# Patient Record
Sex: Male | Born: 1960 | Race: White | Hispanic: No | Marital: Married | Smoking: Never smoker
Health system: Southern US, Community
[De-identification: ages and names within clinical notes are randomized; demographics above are authoritative.]

## PROBLEM LIST (undated history)

## (undated) DIAGNOSIS — K649 Unspecified hemorrhoids: Secondary | ICD-10-CM

## (undated) DIAGNOSIS — R011 Cardiac murmur, unspecified: Secondary | ICD-10-CM

## (undated) DIAGNOSIS — Z8489 Family history of other specified conditions: Secondary | ICD-10-CM

## (undated) DIAGNOSIS — K635 Polyp of colon: Secondary | ICD-10-CM

## (undated) DIAGNOSIS — M2550 Pain in unspecified joint: Secondary | ICD-10-CM

## (undated) DIAGNOSIS — G4730 Sleep apnea, unspecified: Secondary | ICD-10-CM

## (undated) DIAGNOSIS — M1990 Unspecified osteoarthritis, unspecified site: Secondary | ICD-10-CM

## (undated) DIAGNOSIS — D369 Benign neoplasm, unspecified site: Secondary | ICD-10-CM

## (undated) DIAGNOSIS — I10 Essential (primary) hypertension: Secondary | ICD-10-CM

## (undated) HISTORY — DX: Essential (primary) hypertension: I10

## (undated) HISTORY — PX: OTHER SURGICAL HISTORY: SHX169

## (undated) HISTORY — DX: Polyp of colon: K63.5

## (undated) HISTORY — PX: HERNIA REPAIR: SHX51

## (undated) HISTORY — DX: Pain in unspecified joint: M25.50

## (undated) HISTORY — DX: Sleep apnea, unspecified: G47.30

## (undated) HISTORY — DX: Benign neoplasm, unspecified site: D36.9

## (undated) HISTORY — PX: PLANTAR FASCIA RELEASE: SHX2239

## (undated) HISTORY — PX: APPENDECTOMY: SHX54

## (undated) HISTORY — PX: STAPLE HEMORRHOIDECTOMY: SHX2438

## (undated) HISTORY — DX: Unspecified hemorrhoids: K64.9

---

## 2000-12-22 ENCOUNTER — Ambulatory Visit (HOSPITAL_COMMUNITY): Admission: RE | Admit: 2000-12-22 | Discharge: 2000-12-22 | Payer: Self-pay | Admitting: Internal Medicine

## 2003-06-13 ENCOUNTER — Encounter (INDEPENDENT_AMBULATORY_CARE_PROVIDER_SITE_OTHER): Payer: Self-pay | Admitting: Specialist

## 2003-06-13 ENCOUNTER — Ambulatory Visit (HOSPITAL_COMMUNITY): Admission: RE | Admit: 2003-06-13 | Discharge: 2003-06-13 | Payer: Self-pay | Admitting: General Surgery

## 2005-10-25 ENCOUNTER — Ambulatory Visit (HOSPITAL_COMMUNITY): Admission: RE | Admit: 2005-10-25 | Discharge: 2005-10-25 | Payer: Self-pay | Admitting: Internal Medicine

## 2006-11-18 ENCOUNTER — Ambulatory Visit (HOSPITAL_BASED_OUTPATIENT_CLINIC_OR_DEPARTMENT_OTHER): Admission: RE | Admit: 2006-11-18 | Discharge: 2006-11-18 | Payer: Self-pay | Admitting: Orthopedic Surgery

## 2007-10-26 ENCOUNTER — Ambulatory Visit (HOSPITAL_COMMUNITY): Admission: RE | Admit: 2007-10-26 | Discharge: 2007-10-26 | Payer: Self-pay | Admitting: General Surgery

## 2007-10-30 ENCOUNTER — Other Ambulatory Visit: Admission: RE | Admit: 2007-10-30 | Discharge: 2007-10-30 | Payer: Self-pay | Admitting: Otolaryngology

## 2007-11-16 ENCOUNTER — Encounter: Admission: RE | Admit: 2007-11-16 | Discharge: 2007-11-16 | Payer: Self-pay | Admitting: Otolaryngology

## 2009-04-08 DIAGNOSIS — D369 Benign neoplasm, unspecified site: Secondary | ICD-10-CM

## 2009-04-08 HISTORY — DX: Benign neoplasm, unspecified site: D36.9

## 2009-12-04 ENCOUNTER — Encounter: Payer: Self-pay | Admitting: Internal Medicine

## 2009-12-08 ENCOUNTER — Encounter: Payer: Self-pay | Admitting: Internal Medicine

## 2009-12-13 ENCOUNTER — Ambulatory Visit (HOSPITAL_COMMUNITY): Admission: RE | Admit: 2009-12-13 | Discharge: 2009-12-13 | Payer: Self-pay | Admitting: Internal Medicine

## 2009-12-13 ENCOUNTER — Ambulatory Visit: Payer: Self-pay | Admitting: Internal Medicine

## 2009-12-13 HISTORY — PX: COLONOSCOPY: SHX174

## 2009-12-18 ENCOUNTER — Encounter: Payer: Self-pay | Admitting: Internal Medicine

## 2009-12-22 ENCOUNTER — Ambulatory Visit: Admission: RE | Admit: 2009-12-22 | Discharge: 2009-12-22 | Payer: Self-pay | Admitting: Internal Medicine

## 2010-05-08 NOTE — Letter (Signed)
Summary: Internal Other /triage/rx instructions  Internal Other /triage/rx instructions   Imported By: Cloria Spring LPN 16/01/9603 54:09:81  _____________________________________________________________________  External Attachment:    Type:   Image     Comment:   External Document

## 2010-05-08 NOTE — Letter (Signed)
Summary: Patient Notice, Colon Biopsy Results  [facility]  [address]   [place], [place] [address]   Phone: [phone]  Fax: [phone]       December 18, 2009   Carl Kaufman [address] [place], [place]  [address] July 04, 1960    Dear [name],  I am pleased to inform you that the biopsies taken during your recent colonoscopy did not show any evidence of cancer upon pathologic examination.  Additional information/recommendations:  You should have a repeat colonoscopy examination  in 7 years.  Please call us if you are having persistent problems or have questions about your condition that have not been fully answered at this time.  Sincerely,    R. Roetta Sessions MD, Caleen Essex  [facility] Ph: [phone]    Fax: [phone]   Appended Document: Patient Notice, Colon Biopsy Results letter mailed to pt  Appended Document: Patient Notice, Colon Biopsy Results REMINDER FOR TCS IN 7 YRS IS IN THE COMPUTER

## 2010-05-08 NOTE — Letter (Signed)
Summary: External Other  External Other   Imported By: Rexene Alberts 12/04/2009 16:52:39  _____________________________________________________________________  External Attachment:    Type:   Image     Comment:   External Document

## 2010-08-21 NOTE — Op Note (Signed)
NAME:  Carl Kaufman, Carl Kaufman NO.:  [id number]   MEDICAL RECORD NO.:  [id number]          PATIENT TYPE:  AMB   LOCATION:  DAY                           FACILITY:  [facility]   PHYSICIAN:  Dalia Heading, M.D.  DATE OF BIRTH:  [birth year]   DATE OF PROCEDURE:  10/26/2007  DATE OF DISCHARGE:                               OPERATIVE REPORT   PREOPERATIVE DIAGNOSIS:  Right inguinal hernia.   POSTOPERATIVE DIAGNOSIS:  Right inguinal hernia.   PROCEDURE:  Right inguinal herniorrhaphy.   SURGEON:  Dalia Heading, MD   ANESTHESIA:  General.   INDICATIONS:  The patient is a 50 year old white male who presents with  a symptomatic right inguinal hernia.  The risks and benefits of the  procedure including bleeding, infection, pain, and recurrence of the  hernia were fully explained to the patient, I gave informed consent.   PROCEDURE NOTE:  The patient was placed in the supine position.  After  general anesthesia was administered, the right inguinal region was  prepped and draped in the usual sterile technique with Betadine.  Surgical site confirmation was performed.   A transverse incision was made in the right groin region down to the  external oblique aponeurosis.  The aponeurosis was incised to the  external ring.  A Penrose drain was placed around the spermatic cord.  The vas deferens was noted within the spermatic cord.  The ilioinguinal  nerve was identified and retracted inferiorly from the operative field.  A small lipoma and the cord was excised.  An indirect hernial was found.  This was freed away up to the peritoneal reflection and inverted.  A  medium-size polypropylene mesh plug was then placed into this region.  An onlay polypropylene mesh patch was then placed along the floor of the  inguinal canal and secured superiorly to the conjoined tendon and  inferiorly to the shelving edge Poupart's ligament using 2-0 Novofil  interrupted sutures.  The internal ring was  recreated using a 2-0  Novofil interrupted suture.  The external oblique aponeurosis was  reapproximated using a 2-0 Vicryl running suture.  Subcutaneous layer  was reapproximated using a 3-0 Vicryl interrupted suture.  Skin was  closed using a 4-0 Vicryl subcuticular suture.  A 0.5-cm Sensorcaine was  instilled in the surrounding wound.  Dermabond was then applied.   All tape and needle counts were correct at the end of the procedure.  The patient was awakened and transferred to PACU in stable condition.   COMPLICATIONS:  None.   SPECIMEN:  None.   ESTIMATED BLOOD LOSS:  Minimal.      Dalia Heading, M.D.  Electronically Signed     MAJ/MEDQ  D:  10/26/2007  T:  10/26/2007  Job:  [id number]   cc:   Kingsley Callander. Ouida Sills, MD  Fax: [phone]

## 2010-08-21 NOTE — H&P (Signed)
NAME:  Carl Kaufman, ACE NO.:  [id number]   MEDICAL RECORD NO.:  [id number]          PATIENT TYPE:  AMB   LOCATION:  DAY                           FACILITY:  [facility]   PHYSICIAN:  Dalia Heading, M.D.  DATE OF BIRTH:  09-13-1960   DATE OF ADMISSION:  DATE OF DISCHARGE:  LH                              HISTORY & PHYSICAL   CHIEF COMPLAINT:  Right inguinal hernia.   HISTORY OF PRESENT ILLNESS:  The patient is a 50 year old white male who  is referred for evaluation and treatment of right inguinal hernia.  It  has been present for sometime, but is increasing in size and is causing  him discomfort.  It is made worse with straining.   PAST MEDICAL HISTORY:  Includes chronic prostatitis.   PAST SURGICAL HISTORY:  1. Appendectomy.  2. Bilateral foot surgeries.  3. Neck lymph node excision.   CURRENT MEDICATIONS:  Ciprofloxacin.   ALLERGIES:  No known drug allergies.   REVIEW OF SYSTEMS:  The patient denies any recent chest pain, MI, CVA,  or diabetes mellitus.  He denies any bleeding disorders.   On physical examination, the patient is a well-developed, well-  nourished, white male in no acute distress.  Lungs are clear to  auscultation with equal breath sounds bilaterally.  Heart examination  reveals a regular rate and rhythm without S3, S4, or murmurs.  The  abdomen is soft, nontender, and nondistended.  No hepatosplenomegaly or  masses are noted.  A reducible right inguinal hernia is present.  No  left inguinal hernia is present.   IMPRESSION:  Right inguinal hernia.   PLAN:  The patient is scheduled for right inguinal herniorrhaphy on October 26, 2007.  The risks and benefits of the procedure including bleeding,  infection, pain, and recurrence of a hernia were fully explained to the  patient, who gave informed consent.      Dalia Heading, M.D.  Electronically Signed     MAJ/MEDQ  D:  10/20/2007  T:  10/21/2007  Job:  [id number]   cc:   Kingsley Callander. Ouida Sills,  MD  Fax: [phone]

## 2010-08-21 NOTE — Op Note (Signed)
NAME:  Carl Kaufman, Carl Kaufman NO.:  [id number]   MEDICAL RECORD NO.:  [id number]          PATIENT TYPE:  AMB   LOCATION:  DSC                          FACILITY:  [facility]   PHYSICIAN:  Leonides Grills, M.D.     DATE OF BIRTH:  1961/02/02   DATE OF PROCEDURE:  11/18/2006  DATE OF DISCHARGE:                               OPERATIVE REPORT   PREOPERATIVE DIAGNOSIS:  Left hallux rigidus.   POSTOPERATIVE DIAGNOSIS:  Left hallux rigidus.   OPERATION:  Left great toe cheilectomy.   ANESTHESIA:  General with block.   SURGEON:  Leonides Grills, MD   ASSISTANT:  Evlyn Kanner, P.A.   ESTIMATED BLOOD LOSS:  Minimal.   TOURNIQUET TIME:  Approximately 20 minutes.   COMPLICATIONS:  None.   DISPOSITION:  Stable to PR.   INDICATIONS:  A 50 year old male who has had longstanding left dorsal  great toe pain that is interfering with his life to the point that he  can not do what he wants to do.  He was consented for the above  procedure.  All risks including infection, nerve or vessel injury,  persistent pain, worse pain, stiffness, arthritis, possible future  fusion versus hemiarthroplasty were all explained, questions were  encouraged and answered.   OPERATION:  The patient brought to the operating room, placed in supine  position.  After adequate general endotracheal tube anesthesia  administered as well as Ancef 1 gram IV piggyback with block the left  lower extremity is prepped, draped in sterile manner over a proximally  thigh tourniquet.  Limb was gravity exsanguinated.  Tourniquet was  elevated 290 mmHg.  Longitudinal incision just medial the EHL tendon was  then made.  Dissection was carried down through skin.  Hemostasis was  obtained.  EHL tendon was protected within its sheath.  A longitudinal  capsulotomy was then made, the spur was then carefully dissected out  both the medial and laterally and with a sagittal saw this was then  removed.  The  spur dorsally at the base  of the proximal phalanx as well  as in both gutters were debrided carefully. The joint and area copiously  irrigated with normal saline.  Bone wax applied to all  exposed bony surfaces.  Range of motion toe was excellent at the end of  procedure.  Tourniquet deflated, hemostasis was obtained.  Capsules  closed with 3-0 Vicryl stitch.  Skin was closed 4-0 nylon stitch.  Sterile dressing was applied.  Hard sole shoe applied.  Patient stable  to PR.      Leonides Grills, M.D.  Electronically Signed     PB/MEDQ  D:  11/18/2006  T:  11/19/2006  Job:  [id number]

## 2010-08-24 NOTE — Op Note (Signed)
NAME:  Carl Kaufman, BAMBER                         ACCOUNT NO.:  [id number]   MEDICAL RECORD NO.:  [id number]                   PATIENT TYPE:  AMB   LOCATION:  DAY                                  FACILITY:  [facility]   PHYSICIAN:  Ollen Gross. Vernell Morgans, M.D.              DATE OF BIRTH:  08-21-1960   DATE OF PROCEDURE:  06/13/2003  DATE OF DISCHARGE:  06/13/2003                                 OPERATIVE REPORT   PREOPERATIVE DIAGNOSIS:  Prolapsed internal hemorrhoids.   POSTOPERATIVE DIAGNOSIS:  Prolapsed internal hemorrhoids.   PROCEDURE:  PPH hemorrhoidectomy.   SURGEON:  Dr. Carolynne Edouard.   ASSISTANT:  Dr. Magnus Ivan.   ANESTHESIA:  General endotracheal.   DESCRIPTION OF PROCEDURE:  After informed consent was obtained, the patient  was brought to the operating room, left in the supine position on the  stretcher.  After adequate induction of general anesthesia, the patient was  flipped into a prone position on the operating room table.  All pressure  points were padded.  The patient was placed in the jack-knife position with  the head down.  The buttocks were retracted laterally with tape.  The  perirectal area and rectum were then prepped with Betadine and draped in the  usual sterile manner.  The hemorrhoids were initially injected with 1%  lidocaine with Wydase and massaged for several minutes.  The rest of the  perirectal area was then infiltrated with more 0.25% Marcaine with  epinephrine.  A long Fansler type retractor was then placed within the  rectum and examined circumferentially.  Only thing of note were the internal  hemorrhoids that were fairly significant.  Next, at approximately 4-5 cm of  depth, a circumferential pursestring stitch was placed in the rectum.  This  was 4-5 cm of depth above the dentate line.  Once the pursestring stitch was  in place, care was taken to make sure the pursestring suture was only put  through mucosa and submucosa.  The ends of the pursestring were then  brought  out through the [facility] clear plastic retractor, and the retractor was placed  within the rectum as deep as it would go.  The [facility] stapler was then placed  within the rectum, and it was felt to drop below the pursestring stitch.  At  this point, the pursestring stitch was tied down, and the tails of the  pursestring stitch were brought out through the sides of the [facility] stapling  device.  The stapling device was then advanced into the rectum and twisted  down as far as it would go.  A minute was allowed to pass for hemostasis,  and the stapling device was then fired.  Another 30 seconds were allowed to  advance, and then the stapler was backed out a half-turn and brought out in  the rectum.  There was a circumferential staple line within the rectum  approximately 2 cm deep to  the dentate line.  The staple line was inspected,  and it was completely hemostatic.  The donut of tissue was examined, and  there were two bundles of hemorrhoidal tissue.  The width of the specimen  was uniform, and there was no muscle identified.  Next, lidocaine jelly was  placed within the rectum, and a Gelfoam was placed for hemostasis.  Sterile  dressings were  applied.  The patient tolerated the procedure well.  At the end of the case,  all needle, sponge, and instrument counts were correct.  The patient was  then flipped back into a supine position on the stretcher, awakened, and  taken to the recovery room in stable condition.                                               Ollen Gross. Vernell Morgans, M.D.    PST/MEDQ  D:  06/15/2003  T:  06/15/2003  Job:  [id number]

## 2011-01-04 LAB — DIFFERENTIAL
Basophils Absolute: 0
Basophils Relative: 1
Lymphocytes Relative: 31
Monocytes Absolute: 0.6
Monocytes Relative: 10
Neutro Abs: 3.3

## 2011-01-04 LAB — BASIC METABOLIC PANEL
BUN: 11
GFR calc Af Amer: >60
Sodium: 142

## 2011-01-04 LAB — CBC
Hemoglobin: 14.8
MCV: 86.8
Platelets: 185

## 2011-01-21 LAB — POCT HEMOGLOBIN-HEMACUE: Hemoglobin: 14.7

## 2014-04-08 HISTORY — PX: HEMORRHOID BANDING: SHX5850

## 2014-11-29 ENCOUNTER — Encounter: Payer: Self-pay | Admitting: Internal Medicine

## 2014-12-08 ENCOUNTER — Ambulatory Visit (INDEPENDENT_AMBULATORY_CARE_PROVIDER_SITE_OTHER): Payer: Managed Care, Other (non HMO) | Admitting: Nurse Practitioner

## 2014-12-08 ENCOUNTER — Encounter: Payer: Self-pay | Admitting: Nurse Practitioner

## 2014-12-08 ENCOUNTER — Other Ambulatory Visit: Payer: Self-pay

## 2014-12-08 VITALS — BP 144/88 | HR 55 | Temp 97.4°F | Ht 69.0 in | Wt 220.2 lb

## 2014-12-08 DIAGNOSIS — K625 Hemorrhage of anus and rectum: Secondary | ICD-10-CM

## 2014-12-08 DIAGNOSIS — K649 Unspecified hemorrhoids: Secondary | ICD-10-CM | POA: Diagnosis not present

## 2014-12-08 DIAGNOSIS — Z86010 Personal history of colonic polyps: Secondary | ICD-10-CM | POA: Diagnosis not present

## 2014-12-08 MED ORDER — PEG-KCL-NACL-NASULF-NA ASC-C 100 G PO SOLR: 1.0000 | ORAL | Status: DC

## 2014-12-08 NOTE — Patient Instructions (Addendum)
1. We will schedule your procedure for you. 2. Further recommendations to be based on results of your procedure. 

## 2014-12-08 NOTE — Assessment & Plan Note (Signed)
Patient with a long-standing history of hemorrhoids and he had stable hemorrhoidectomy approximately 15 or 16 years ago, which did not resolve his symptoms. However he has dealt with his symptoms since then. He is currently having frequent mild to moderate rectal bleeding as well as rectal pain and irritation and swallowing. Sometimes swallowing is enough to give him the sensation of not being able to complete his bowel movement. Topical cream has not helped his symptoms. He is interested and possible hemorrhoid banding. He is nearly due for colonoscopy so we'll proceed with this in order to evaluate his hemorrhoids and candidacy for banding.  If he is a candidate for hemorrhoid banding we can arrange for a banding outpatient office visit as follow-up.

## 2014-12-08 NOTE — Progress Notes (Signed)
CC'ED TO PCP 

## 2014-12-08 NOTE — Progress Notes (Signed)
Primary Care Physician:  Asencion Noble, MD Primary Gastroenterologist:  Dr. Gala Romney  Chief Complaint  Patient presents with  . Hemorrhoids    HPI:   54 year old male referred by PCP for possible repeat colonoscopy and consideration of hemorrhoid banding. PCP note reviewed. He notices a live difficulty with his external hemorrhoids and topical cream has not helped has intermittent bleeding in a lot of pain. Has a history of tubular adenoma with last colonoscopy completed 12/13/2009. His colonoscopy found a single diminutive polyp at the bases cecum which was removed and found to be tubular adenoma without high-grade dysplasia. Repeat surveillance colonoscopy was recommended for 7 years (2018).  Today he states he has a long history of hemorrhoids, had hemorrhoid surgery about 15-16 years without relief. Topical cream not helping at all. Has rectal bleeding sometimes daily for up to a week and will have flares about every other week. Amount is anywhere from on the stool/in the water. Occasionally will just have a sting of blood on his stool. Sometimes just toilet tissue hematochezia. Other symptoms include pain, swelling, sensation of inability to complete his bowel movement. Has a bowel movement daily typically [place] 4, occasional constipation. Denies chest pain, dyspnea, dizziness, lightheadedness, syncope, near syncope. Denies any other upper or lower GI symptoms.  Past Medical History  Diagnosis Date  . Hypertension   . Joint pain   . Sleep apnea     No CPAP at home required    Past Surgical History  Procedure Laterality Date  . Staple hemorrhoidectomy      Current Outpatient Prescriptions  Medication Sig Dispense Refill  . lisinopril (PRINIVIL,ZESTRIL) 10 MG tablet Take 10 mg by mouth daily.    . meloxicam (MOBIC) 15 MG tablet Take 15 mg by mouth daily.     No current facility-administered medications for this visit.    Allergies as of 12/08/2014  . (No Known Allergies)     Family History  Problem Relation Age of Onset  . Colon cancer Neg Hx     Social History   Social History  . Marital Status: Married    Spouse Name: N/A  . Number of Children: N/A  . Years of Education: N/A   Occupational History  . Not on file.   Social History Main Topics  . Smoking status: Never Smoker   . Smokeless tobacco: Current User    Types: Snuff     Comment: snuff  . Alcohol Use: 0.0 oz/week    0 Standard drinks or equivalent per week     Comment: Only in summer 1-2 drinks 4 days out of the week  . Drug Use: No  . Sexual Activity: Not on file   Other Topics Concern  . Not on file   Social History Narrative  . No narrative on file    Review of Systems: General: Negative for anorexia, weight loss, fever, chills, fatigue, weakness. Eyes: Negative for vision changes.  ENT: Negative for hoarseness, difficulty swallowing. CV: Negative for chest pain, angina, palpitations, peripheral edema.  Respiratory: Negative for dyspnea at rest, cough, sputum, wheezing.  GI: See history of present illness. Derm: Negative for rash or itching.  Endo: Negative for unusual weight change.  Heme: Negative for bruising or bleeding. Allergy: Negative for rash or hives.    Physical Exam: BP 144/88 mmHg  Pulse 55  Temp(Src) 97.4 F (36.3 C)  Ht 5\' 9"  (1.753 m)  Wt 220 lb 3.2 oz (99.882 kg)  BMI 32.50 kg/m2 General:  Alert and oriented. Pleasant and cooperative. Well-nourished and well-developed.  Head:  Normocephalic and atraumatic. Eyes:  Without icterus, sclera clear and conjunctiva pink.  Ears:  Normal auditory acuity. Cardiovascular:  S1, S2 present without murmurs appreciated. Normal pulses noted. Extremities without clubbing or edema. Respiratory:  Clear to auscultation bilaterally. No wheezes, rales, or rhonchi. No distress.  Gastrointestinal:  +BS, soft, non-tender and non-distended. No HSM noted. No guarding or rebound. No masses appreciated.  Rectal:   Deferred  Neurologic:  Alert and oriented x4;  grossly normal neurologically. Psych:  Alert and cooperative. Normal mood and affect. Heme/Lymph/Immune: No excessive bruising noted.    12/08/2014 10:01 AM

## 2014-12-08 NOTE — Assessment & Plan Note (Signed)
History of tubular adenoma on his colonoscopy in 2011. No family history of colon cancer. Recommended repeat colonoscopy was in 7 years and proceeding with a colonoscopy at this point would be approximately 1-1/2 years early. Even his symptoms and desire for assessment of candidacy for hemorrhoid banding we'll proceed with colonoscopy as above.

## 2014-12-08 NOTE — Assessment & Plan Note (Signed)
Rectal bleeding which seems to be mild to moderate based on his description. Sometimes just toilet tissue hematochezia however sometimes it is more a moderate amount on the stool, in the water or just a long string of blood clot. He does have a history of tubular adenoma with colonoscopy in 2011 with recommended 7 year repeat. Given his symptoms and desire for hemorrhoid banding we will move forward with a colonoscopy approximately 1-1/2 years early to further evaluate his GI bleeding as well as his candidacy for possible hemorrhoid banding as an outpatient.  Proceed with TCS with Dr. Gala Romney in near future: the risks, benefits, and alternatives have been discussed with the patient in detail. The patient states understanding and desires to proceed.  The patient is not on any anticoagulants, antidepressants, chronic pain medications, or anxiolytics. He denies alcohol and drug use. Conscious sedation should be adequate for his procedure.

## 2015-01-02 ENCOUNTER — Ambulatory Visit (HOSPITAL_COMMUNITY)
Admission: RE | Admit: 2015-01-02 | Discharge: 2015-01-02 | Disposition: A | Discharge status: Home Health | Payer: Managed Care, Other (non HMO) | Source: Ambulatory Visit | Attending: Internal Medicine | Admitting: Internal Medicine

## 2015-01-02 ENCOUNTER — Encounter (HOSPITAL_COMMUNITY): Payer: Self-pay

## 2015-01-02 ENCOUNTER — Encounter (HOSPITAL_COMMUNITY)
Admission: RE | Disposition: A | Discharge status: Home Health | Payer: Self-pay | Source: Ambulatory Visit | Attending: Internal Medicine

## 2015-01-02 DIAGNOSIS — K635 Polyp of colon: Secondary | ICD-10-CM

## 2015-01-02 DIAGNOSIS — K625 Hemorrhage of anus and rectum: Secondary | ICD-10-CM

## 2015-01-02 DIAGNOSIS — K921 Melena: Secondary | ICD-10-CM | POA: Insufficient documentation

## 2015-01-02 DIAGNOSIS — K648 Other hemorrhoids: Secondary | ICD-10-CM | POA: Diagnosis not present

## 2015-01-02 DIAGNOSIS — Z79899 Other long term (current) drug therapy: Secondary | ICD-10-CM | POA: Diagnosis not present

## 2015-01-02 DIAGNOSIS — K621 Rectal polyp: Secondary | ICD-10-CM | POA: Diagnosis not present

## 2015-01-02 DIAGNOSIS — K649 Unspecified hemorrhoids: Secondary | ICD-10-CM | POA: Diagnosis not present

## 2015-01-02 DIAGNOSIS — I10 Essential (primary) hypertension: Secondary | ICD-10-CM | POA: Diagnosis not present

## 2015-01-02 DIAGNOSIS — Z860100 Personal history of colon polyps, unspecified: Secondary | ICD-10-CM | POA: Insufficient documentation

## 2015-01-02 HISTORY — DX: Polyp of colon: K63.5

## 2015-01-02 HISTORY — PX: COLONOSCOPY: SHX5424

## 2015-01-02 SURGERY — COLONOSCOPY
Anesthesia: Moderate Sedation

## 2015-01-02 MED ORDER — ONDANSETRON HCL 4 MG/2ML IJ SOLN
INTRAMUSCULAR | Status: DC | PRN
  Administered 2015-01-02: 4 mg via INTRAVENOUS

## 2015-01-02 MED ORDER — STERILE WATER FOR IRRIGATION IR SOLN
Status: DC | PRN
  Administered 2015-01-02: 09:00:00

## 2015-01-02 MED ORDER — MIDAZOLAM HCL 5 MG/5ML IJ SOLN
INTRAMUSCULAR | Status: AC
  Filled 2015-01-02: qty 10

## 2015-01-02 MED ORDER — MEPERIDINE HCL 100 MG/ML IJ SOLN
INTRAMUSCULAR | Status: AC
  Filled 2015-01-02: qty 2

## 2015-01-02 MED ORDER — SODIUM CHLORIDE 0.9 % IV SOLN
INTRAVENOUS | Status: DC
Start: 2015-01-02 — End: 2015-01-02
  Administered 2015-01-02: 09:00:00 via INTRAVENOUS

## 2015-01-02 MED ORDER — ONDANSETRON HCL 4 MG/2ML IJ SOLN
INTRAMUSCULAR | Status: AC
  Filled 2015-01-02: qty 2

## 2015-01-02 MED ORDER — MIDAZOLAM HCL 5 MG/5ML IJ SOLN
INTRAMUSCULAR | Status: DC | PRN
  Administered 2015-01-02: 1 mg via INTRAVENOUS
  Administered 2015-01-02 (×2): 2 mg via INTRAVENOUS

## 2015-01-02 MED ORDER — MEPERIDINE HCL 100 MG/ML IJ SOLN
INTRAMUSCULAR | Status: DC | PRN
  Administered 2015-01-02: 50 mg via INTRAVENOUS
  Administered 2015-01-02 (×2): 25 mg via INTRAVENOUS

## 2015-01-02 NOTE — Discharge Instructions (Signed)
Colonoscopy Discharge Instructions  Read the instructions outlined below and refer to this sheet in the next few weeks. These discharge instructions provide you with general information on caring for yourself after you leave the hospital. Your doctor may also give you specific instructions. While your treatment has been planned according to the most current medical practices available, unavoidable complications occasionally occur. If you have any problems or questions after discharge, call Dr. Gala Romney at [phone]. ACTIVITY  You may resume your regular activity, but move at a slower pace for the next 24 hours.   Take frequent rest periods for the next 24 hours.   Walking will help get rid of the air and reduce the bloated feeling in your belly (abdomen).   No driving for 24 hours (because of the medicine (anesthesia) used during the test).    Do not sign any important legal documents or operate any machinery for 24 hours (because of the anesthesia used during the test).  NUTRITION  Drink plenty of fluids.   You may resume your normal diet as instructed by your doctor.   Begin with a light meal and progress to your normal diet. Heavy or fried foods are harder to digest and may make you feel sick to your stomach (nauseated).   Avoid alcoholic beverages for 24 hours or as instructed.  MEDICATIONS  You may resume your normal medications unless your doctor tells you otherwise.  WHAT YOU CAN EXPECT TODAY  Some feelings of bloating in the abdomen.   Passage of more gas than usual.   Spotting of blood in your stool or on the toilet paper.  IF YOU HAD POLYPS REMOVED DURING THE COLONOSCOPY:  No aspirin products for 7 days or as instructed.   No alcohol for 7 days or as instructed.   Eat a soft diet for the next 24 hours.  FINDING OUT THE RESULTS OF YOUR TEST Not all test results are available during your visit. If your test results are not back during the visit, make an appointment  with your caregiver to find out the results. Do not assume everything is normal if you have not heard from your caregiver or the medical facility. It is important for you to follow up on all of your test results.  SEEK IMMEDIATE MEDICAL ATTENTION IF:  You have more than a spotting of blood in your stool.   Your belly is swollen (abdominal distention).   You are nauseated or vomiting.   You have a temperature over 101.   You have abdominal pain or discomfort that is severe or gets worse throughout the day.     Hemorrhoid and polyp information provided  Further recommendations to follow pending review of pathology report  Schedule in office hemorrhoid banding with Dr. Gala Romney this coming Friday-January 06, 2015 at 8:00  Hemorrhoids Hemorrhoids are swollen veins around the rectum or anus. There are two types of hemorrhoids:   Internal hemorrhoids. These occur in the veins just inside the rectum. They may poke through to the outside and become irritated and painful.  External hemorrhoids. These occur in the veins outside the anus and can be felt as a painful swelling or hard lump near the anus. CAUSES  Pregnancy.   Obesity.   Constipation or diarrhea.   Straining to have a bowel movement.   Sitting for long periods on the toilet.  Heavy lifting or other activity that caused you to strain.  Anal intercourse. SYMPTOMS   Pain.   Anal itching or  irritation.   Rectal bleeding.   Fecal leakage.   Anal swelling.   One or more lumps around the anus.  DIAGNOSIS  Your caregiver may be able to diagnose hemorrhoids by visual examination. Other examinations or tests that may be performed include:   Examination of the rectal area with a gloved hand (digital rectal exam).   Examination of anal canal using a small tube (scope).   A blood test if you have lost a significant amount of blood.  A test to look inside the colon (sigmoidoscopy or  colonoscopy). TREATMENT Most hemorrhoids can be treated at home. However, if symptoms do not seem to be getting better or if you have a lot of rectal bleeding, your caregiver may perform a procedure to help make the hemorrhoids get smaller or remove them completely. Possible treatments include:   Placing a rubber band at the base of the hemorrhoid to cut off the circulation (rubber band ligation).   Injecting a chemical to shrink the hemorrhoid (sclerotherapy).   Using a tool to burn the hemorrhoid (infrared light therapy).   Surgically removing the hemorrhoid (hemorrhoidectomy).   Stapling the hemorrhoid to block blood flow to the tissue (hemorrhoid stapling).  HOME CARE INSTRUCTIONS   Eat foods with fiber, such as whole grains, beans, nuts, fruits, and vegetables. Ask your doctor about taking products with added fiber in them (fibersupplements).  Increase fluid intake. Drink enough water and fluids to keep your urine clear or pale yellow.   Exercise regularly.   Go to the bathroom when you have the urge to have a bowel movement. Do not wait.   Avoid straining to have bowel movements.   Keep the anal area dry and clean. Use wet toilet paper or moist towelettes after a bowel movement.   Medicated creams and suppositories may be used or applied as directed.   Only take over-the-counter or prescription medicines as directed by your caregiver.   Take warm sitz baths for 15-20 minutes, 3-4 times a day to ease pain and discomfort.   Place ice packs on the hemorrhoids if they are tender and swollen. Using ice packs between sitz baths may be helpful.   Put ice in a plastic bag.   Place a towel between your skin and the bag.   Leave the ice on for 15-20 minutes, 3-4 times a day.   Do not use a donut-shaped pillow or sit on the toilet for long periods. This increases blood pooling and pain.  SEEK MEDICAL CARE IF:  You have increasing pain and swelling that is not  controlled by treatment or medicine.  You have uncontrolled bleeding.  You have difficulty or you are unable to have a bowel movement.  You have pain or inflammation outside the area of the hemorrhoids. MAKE SURE YOU:  Understand these instructions.  Will watch your condition.  Will get help right away if you are not doing well or get worse.  Colon Polyps Polyps are lumps of extra tissue growing inside the body. Polyps can grow in the large intestine (colon). Most colon polyps are noncancerous (benign). However, some colon polyps can become cancerous over time. Polyps that are larger than a pea may be harmful. To be safe, caregivers remove and test all polyps. CAUSES  Polyps form when mutations in the genes cause your cells to grow and divide even though no more tissue is needed. RISK FACTORS There are a number of risk factors that can increase your chances of getting colon polyps.  They include:  Being older than 50 years.  Family history of colon polyps or colon cancer.  Long-term colon diseases, such as colitis or Crohn disease.  Being overweight.  Smoking.  Being inactive.  Drinking too much alcohol. SYMPTOMS  Most small polyps do not cause symptoms. If symptoms are present, they may include:  Blood in the stool. The stool may look dark red or black.  Constipation or diarrhea that lasts longer than 1 week. DIAGNOSIS People often do not know they have polyps until their caregiver finds them during a regular checkup. Your caregiver can use 4 tests to check for polyps:  Digital rectal exam. The caregiver wears gloves and feels inside the rectum. This test would find polyps only in the rectum.  Barium enema. The caregiver puts a liquid called barium into your rectum before taking X-rays of your colon. Barium makes your colon look white. Polyps are dark, so they are easy to see in the X-ray pictures.  Sigmoidoscopy. A thin, flexible tube (sigmoidoscope) is placed into  your rectum. The sigmoidoscope has a light and tiny camera in it. The caregiver uses the sigmoidoscope to look at the last third of your colon.  Colonoscopy. This test is like sigmoidoscopy, but the caregiver looks at the entire colon. This is the most common method for finding and removing polyps. TREATMENT  Any polyps will be removed during a sigmoidoscopy or colonoscopy. The polyps are then tested for cancer. PREVENTION  To help lower your risk of getting more colon polyps:  Eat plenty of fruits and vegetables. Avoid eating fatty foods.  Do not smoke.  Avoid drinking alcohol.  Exercise every day.  Lose weight if recommended by your caregiver.  Eat plenty of calcium and folate. Foods that are rich in calcium include milk, cheese, and broccoli. Foods that are rich in folate include chickpeas, kidney beans, and spinach. HOME CARE INSTRUCTIONS Keep all follow-up appointments as directed by your caregiver. You may need periodic exams to check for polyps. SEEK MEDICAL CARE IF: You notice bleeding during a bowel movement.

## 2015-01-02 NOTE — Op Note (Signed)
[facility] [address] [place], [address]   COLONOSCOPY PROCEDURE REPORT  PATIENT: Carl Kaufman, Carl Kaufman  MR#: [id number] BIRTHDATE: [birth year] , 28  yrs. old GENDER: male ENDOSCOPIST: R.  Garfield Cornea, MD FACP [facility] REFERRED BY:Roy Willey Blade, M.D. PROCEDURE DATE:  21-Jan-2015 PROCEDURE:   Colonoscopy with biopsy INDICATIONS:Paper hematochezia. MEDICATIONS: Versed and 5 mg IV and Demerol 100 mg IV in divided doses.  Zofran 4 mg IV. ASA CLASS:       Class II  CONSENT: The risks, benefits, alternatives and imponderables including but not limited to bleeding, perforation as well as the possibility of a missed lesion have been reviewed.  The potential for biopsy, lesion removal, etc. have also been discussed. Questions have been answered.  All parties agreeable.  Please see the history and physical in the medical record for more information.  DESCRIPTION OF PROCEDURE:   After the risks benefits and alternatives of the procedure were thoroughly explained, informed consent was obtained.  The digital rectal exam revealed grade 3 hemorrhoids        The EC-[id number] ([id number])  endoscope was introduced through the anus and advanced to the cecum, which was identified by both the appendix and ileocecal valve. No adverse events experienced.   The quality of the prep was adequate  The instrument was then slowly withdrawn as the colon was fully examined. Estimated blood loss is zero unless otherwise noted in this procedure report.      COLON FINDINGS: Grade 3/internal hemorrhoids present.  Nearly circumferential apparent surgical scar distal rectum just proximal to the anal verge and internal hemorrhoidal plexus.  No evidence of obvious neoplasm or other abnormality.  The colonic mucosa appeared normal except for one diminutive polyp in the base of the cecum  The abnormal mucosa at the scar line in the rectum was biopsied for histologic study also the cecal polyp was cold  biopsied/removed. Retroflexion was performed. .  Withdrawal time=11 minutes 0 seconds.  The scope was withdrawn and the procedure completed. COMPLICATIONS: There were no immediate complications.  ENDOSCOPIC IMPRESSION: Grade 3/internal hemorrhoids. Postsurgical changes in the rectum presumably secondary to prior hemorrhoid surgery. Status post biopsy. Cecal polyp?"removed as described above.   Patient has had chronic hemorrhoid issues for years. His hemorrhoids would be amenable to office banding. I discussed this approach at length with the patient. The risk and benefits reviewed. Although, I could not guarantee him 100%resolution of all of his symptoms, I feel that we could help him significantly with banding.    RECOMMENDATIONS: Follow-up pathology. Tentatively, schedule an office hemorrhoid banding in the near future.  eSigned:  R. Garfield Cornea, MD [facility]al[facility]ss[facility] 2015/01/21 9:40 AM   cc:  CPT CODES: ICD CODES:  The ICD and CPT codes recommended by this software are interpretations from the data that the clinical staff has captured with the software.  The verification of the translation of this report to the ICD and CPT codes and modifiers is the sole responsibility of the health care institution and practicing physician where this report was generated.  [employer]. will not be held responsible for the validity of the ICD and CPT codes included on this report.  AMA assumes no liability for data contained or not contained herein. CPT is a Designer, television/film set of the [employer].  PATIENT NAME:  Carl Kaufman MR#: [id number]

## 2015-01-02 NOTE — Interval H&P Note (Signed)
History and Physical Interval Note:  01/02/2015 9:04 AM  Carl Kaufman  has presented today for surgery, with the diagnosis of RECTAL BLEEDING/HEMORRHOIDS  The various methods of treatment have been discussed with the patient and family. After consideration of risks, benefits and other options for treatment, the patient has consented to  Procedure(s) with comments: COLONOSCOPY (N/A) - 607 as a surgical intervention .  The patient's history has been reviewed, patient examined, no change in status, stable for surgery.  I have reviewed the patient's chart and labs.  Questions were answered to the patient's satisfaction.     Carl Kaufman  No change. Diagnostic colonoscopy per plan.The risks, benefits, limitations, alternatives and imponderables have been reviewed with the patient. Questions have been answered. All parties are agreeable.

## 2015-01-02 NOTE — H&P (View-Only) (Signed)
Primary Care Physician:  Asencion Noble, MD Primary Gastroenterologist:  Dr. Gala Romney  Chief Complaint  Patient presents with  . Hemorrhoids    HPI:   54 year old male referred by PCP for possible repeat colonoscopy and consideration of hemorrhoid banding. PCP note reviewed. He notices a live difficulty with his external hemorrhoids and topical cream has not helped has intermittent bleeding in a lot of pain. Has a history of tubular adenoma with last colonoscopy completed 12/13/2009. His colonoscopy found a single diminutive polyp at the bases cecum which was removed and found to be tubular adenoma without high-grade dysplasia. Repeat surveillance colonoscopy was recommended for 7 years (2018).  Today he states he has a long history of hemorrhoids, had hemorrhoid surgery about 15-16 years without relief. Topical cream not helping at all. Has rectal bleeding sometimes daily for up to a week and will have flares about every other week. Amount is anywhere from on the stool/in the water. Occasionally will just have a sting of blood on his stool. Sometimes just toilet tissue hematochezia. Other symptoms include pain, swelling, sensation of inability to complete his bowel movement. Has a bowel movement daily typically [place] 4, occasional constipation. Denies chest pain, dyspnea, dizziness, lightheadedness, syncope, near syncope. Denies any other upper or lower GI symptoms.  Past Medical History  Diagnosis Date  . Hypertension   . Joint pain   . Sleep apnea     No CPAP at home required    Past Surgical History  Procedure Laterality Date  . Staple hemorrhoidectomy      Current Outpatient Prescriptions  Medication Sig Dispense Refill  . lisinopril (PRINIVIL,ZESTRIL) 10 MG tablet Take 10 mg by mouth daily.    . meloxicam (MOBIC) 15 MG tablet Take 15 mg by mouth daily.     No current facility-administered medications for this visit.    Allergies as of 12/08/2014  . (No Known Allergies)     Family History  Problem Relation Age of Onset  . Colon cancer Neg Hx     Social History   Social History  . Marital Status: Married    Spouse Name: N/A  . Number of Children: N/A  . Years of Education: N/A   Occupational History  . Not on file.   Social History Main Topics  . Smoking status: Never Smoker   . Smokeless tobacco: Current User    Types: Snuff     Comment: snuff  . Alcohol Use: 0.0 oz/week    0 Standard drinks or equivalent per week     Comment: Only in summer 1-2 drinks 4 days out of the week  . Drug Use: No  . Sexual Activity: Not on file   Other Topics Concern  . Not on file   Social History Narrative  . No narrative on file    Review of Systems: General: Negative for anorexia, weight loss, fever, chills, fatigue, weakness. Eyes: Negative for vision changes.  ENT: Negative for hoarseness, difficulty swallowing. CV: Negative for chest pain, angina, palpitations, peripheral edema.  Respiratory: Negative for dyspnea at rest, cough, sputum, wheezing.  GI: See history of present illness. Derm: Negative for rash or itching.  Endo: Negative for unusual weight change.  Heme: Negative for bruising or bleeding. Allergy: Negative for rash or hives.    Physical Exam: BP 144/88 mmHg  Pulse 55  Temp(Src) 97.4 F (36.3 C)  Ht 5\' 9"  (1.753 m)  Wt 220 lb 3.2 oz (99.882 kg)  BMI 32.50 kg/m2 General:  Alert and oriented. Pleasant and cooperative. Well-nourished and well-developed.  Head:  Normocephalic and atraumatic. Eyes:  Without icterus, sclera clear and conjunctiva pink.  Ears:  Normal auditory acuity. Cardiovascular:  S1, S2 present without murmurs appreciated. Normal pulses noted. Extremities without clubbing or edema. Respiratory:  Clear to auscultation bilaterally. No wheezes, rales, or rhonchi. No distress.  Gastrointestinal:  +BS, soft, non-tender and non-distended. No HSM noted. No guarding or rebound. No masses appreciated.  Rectal:   Deferred  Neurologic:  Alert and oriented x4;  grossly normal neurologically. Psych:  Alert and cooperative. Normal mood and affect. Heme/Lymph/Immune: No excessive bruising noted.    12/08/2014 10:01 AM

## 2015-01-03 ENCOUNTER — Encounter: Payer: Self-pay | Admitting: Internal Medicine

## 2015-01-05 ENCOUNTER — Encounter (HOSPITAL_COMMUNITY): Payer: Self-pay | Admitting: Internal Medicine

## 2015-01-06 ENCOUNTER — Encounter: Payer: Managed Care, Other (non HMO) | Admitting: Internal Medicine

## 2015-01-10 ENCOUNTER — Ambulatory Visit: Payer: Managed Care, Other (non HMO) | Admitting: Internal Medicine

## 2015-01-11 ENCOUNTER — Encounter: Payer: Self-pay | Admitting: Internal Medicine

## 2015-02-07 ENCOUNTER — Encounter: Payer: Managed Care, Other (non HMO) | Admitting: Internal Medicine

## 2015-02-17 ENCOUNTER — Encounter: Payer: Self-pay | Admitting: Internal Medicine

## 2015-02-17 ENCOUNTER — Telehealth: Payer: Self-pay

## 2015-02-17 ENCOUNTER — Ambulatory Visit (INDEPENDENT_AMBULATORY_CARE_PROVIDER_SITE_OTHER): Payer: Managed Care, Other (non HMO) | Admitting: Internal Medicine

## 2015-02-17 VITALS — BP 153/80 | HR 62 | Temp 97.3°F | Ht 69.0 in | Wt 227.0 lb

## 2015-02-17 DIAGNOSIS — K648 Other hemorrhoids: Secondary | ICD-10-CM

## 2015-02-17 NOTE — Telephone Encounter (Signed)
thanks

## 2015-02-17 NOTE — Patient Instructions (Signed)
Nitroglycerin 0.125% cream-apply pea-sized amount into the anorectum 3 times a day  Avoid straining.  Benefiber 2 teaspoons twice daily  Limit toilet time to 2-3 minutes  Call with any interim problems  Schedule followup appointment in 2-3 weeks from now

## 2015-02-17 NOTE — Telephone Encounter (Signed)
Per Dr. Gala Romney, I called in Rx for Nitroglycerin 0.125% to [place] at [facility], to apply pea-sized amount into the anorectum tid. 30 gm and no refills.

## 2015-02-17 NOTE — Progress Notes (Signed)
[place] banding procedure note:  The patient presents with symptomatic grade 3 hemorrhoids, unresponsive to maximal medical therapy, requesting rubber band ligation of his/her hemorrhoidal disease. All risks, benefits, and alternative forms of therapy were described and informed consent was obtained.  In the left lateral decubitus position,anoscopic examination revealed grade 3  hemorrhoids in the left posterior position (s). Probable posterior anal fissure present.  The decision was made to band the left posterior lateral internal hemorrhoid, and the [place] was used to perform band ligation without complication. Digital anorectal examination was then performed to assure proper positioning of the band;  followup DRE demonstrated the band to be in good position. No pinching or pain. The patient was discharged home without pain or other issues. Dietary and behavioral recommendations were given.The patient will return in 2-3 weeks for followup and possible additional banding as required. Zenia Resides, the [employer] rep, was present for technical support only. No complications were encountered and the patient tolerated the procedure well.

## 2015-02-21 ENCOUNTER — Encounter: Payer: Managed Care, Other (non HMO) | Admitting: Internal Medicine

## 2015-03-14 ENCOUNTER — Encounter: Payer: Self-pay | Admitting: Internal Medicine

## 2015-03-14 ENCOUNTER — Ambulatory Visit (INDEPENDENT_AMBULATORY_CARE_PROVIDER_SITE_OTHER): Payer: Managed Care, Other (non HMO) | Admitting: Internal Medicine

## 2015-03-14 VITALS — BP 150/96 | HR 71 | Temp 98.6°F | Ht 69.0 in | Wt 228.0 lb

## 2015-03-14 DIAGNOSIS — K648 Other hemorrhoids: Secondary | ICD-10-CM

## 2015-03-14 NOTE — Progress Notes (Signed)
[place] banding procedure note:  The patient presents with symptomatic grade 3 hemorrhoids, status post banding of a left posterior column previously. Had a little pain after first banding for which he did not call but did okay overall. Overall, less protrusion less discomfort. He desires a second band to be placed.  All risks, benefits, and alternative forms of therapy were described and informed consent was obtained.  In the left lateral decubitus position, a digital rectal exam revealed only a couple of small grade 3 tags.    The decision was made to band the right posterior internal hemorrhoid;  the [place] was used to perform band ligation without complication. Digital anorectal examination was then performed to assure proper positioning of the band, and to adjust the banded tissue as required. The patient was discharged home without pain or other issues. Dietary and behavioral recommendations were given along with follow-up instructions. The patient will return in 2-3 for followup and possible additional banding as required.  No complications were encountered and the patient tolerated the procedure well.

## 2015-03-14 NOTE — Patient Instructions (Signed)
Avoid straining.  Benefiber 2 tablespoons twice daily  use nitroglycerin ointment as needed.  Limit toilet time to 2-3 minutes  Call with any interim problems  Schedule followup appointment in 2-3 weeks from now

## 2015-03-28 ENCOUNTER — Ambulatory Visit (INDEPENDENT_AMBULATORY_CARE_PROVIDER_SITE_OTHER): Payer: Managed Care, Other (non HMO) | Admitting: Internal Medicine

## 2015-03-28 ENCOUNTER — Encounter: Payer: Self-pay | Admitting: Internal Medicine

## 2015-03-28 VITALS — BP 152/85 | HR 68 | Temp 97.1°F | Ht 69.0 in | Wt 233.8 lb

## 2015-03-28 DIAGNOSIS — K642 Third degree hemorrhoids: Secondary | ICD-10-CM | POA: Diagnosis not present

## 2015-03-28 DIAGNOSIS — K648 Other hemorrhoids: Secondary | ICD-10-CM

## 2015-03-28 NOTE — Progress Notes (Signed)
  [place] banding procedure note:  The patient presents with symptomatic grade 3 hemorrhoids - , unresponsive to maximal medical therapy; status post banding of the left posterior and right posterior column. Globally,his hemorrhoid symptoms including protrusion have improved, however,  he continues to have symptoms - he relates he certainly is better than he was prior to the first and being placed. He desires a third day and he placed today.   All risks, benefits and alternative forms of therapy were described and informed consent was obtained.  The decision was made to band the right anterior internal hemorrhoid after DRE performed; the [place] was used to perform band ligation without complication. Digital anorectal examination was then performed to assure proper positioning of the band;  Band found to be in excellent position. No pinching or pain. The patient was discharged home without pain or other issues. Dietary and behavioral recommendations were given. The patient will return in 3 months for follow-up.  No complications were encountered and the patient tolerated the procedure well.

## 2015-03-28 NOTE — Patient Instructions (Signed)
Avoid straining.  Benefiber 1 tablespoon twice daily  Limit toilet time to 2-5 minutes  Call with any interim problems  Schedule followup appointment in 3 months from now

## 2015-05-25 ENCOUNTER — Encounter: Payer: Self-pay | Admitting: Internal Medicine

## 2015-05-26 ENCOUNTER — Encounter: Payer: Self-pay | Admitting: Internal Medicine

## 2017-02-13 ENCOUNTER — Ambulatory Visit (HOSPITAL_COMMUNITY)
Admission: RE | Admit: 2017-02-13 | Discharge: 2017-02-13 | Disposition: A | Discharge status: Home / Self Care | Payer: Managed Care, Other (non HMO) | Source: Ambulatory Visit | Attending: Internal Medicine | Admitting: Internal Medicine

## 2017-02-13 ENCOUNTER — Other Ambulatory Visit (HOSPITAL_COMMUNITY): Payer: Self-pay | Admitting: Internal Medicine

## 2017-02-13 DIAGNOSIS — M79641 Pain in right hand: Secondary | ICD-10-CM | POA: Diagnosis present

## 2017-02-13 DIAGNOSIS — M19041 Primary osteoarthritis, right hand: Secondary | ICD-10-CM | POA: Diagnosis not present

## 2018-01-15 ENCOUNTER — Ambulatory Visit: Payer: Managed Care, Other (non HMO) | Admitting: General Surgery

## 2018-01-20 ENCOUNTER — Ambulatory Visit: Payer: Managed Care, Other (non HMO) | Admitting: General Surgery

## 2018-01-20 ENCOUNTER — Encounter: Payer: Self-pay | Admitting: General Surgery

## 2018-01-20 VITALS — BP 161/88 | HR 55 | Temp 98.9°F | Resp 20 | Wt 231.6 lb

## 2018-01-20 DIAGNOSIS — K429 Umbilical hernia without obstruction or gangrene: Secondary | ICD-10-CM | POA: Diagnosis not present

## 2018-01-20 NOTE — Patient Instructions (Signed)
 Ventral Hernia A ventral hernia is a bulge of tissue from inside the abdomen that pushes through a weak area of the muscles that form the front wall of the abdomen. The tissues inside the abdomen are inside a sac (peritoneum). These tissues include the small intestine, large intestine, and the fatty tissue that covers the intestines (omentum). Sometimes, the bulge that forms a hernia contains intestines. Other hernias contain only fat. Ventral hernias do not go away without surgical treatment. There are several types of ventral hernias. You may have:  A hernia at an incision site from previous abdominal surgery (incisional hernia).  A hernia just above the belly button (epigastric hernia), or at the belly button (umbilical hernia). These types of hernias can develop from heavy lifting or straining.  A hernia that comes and goes (reducible hernia). It may be visible only when you lift or strain. This type of hernia can be pushed back into the abdomen (reduced).  A hernia that traps abdominal tissue inside the hernia (incarcerated hernia). This type of hernia does not reduce.  A hernia that cuts off blood flow to the tissues inside the hernia (strangulated hernia). The tissues can start to die if this happens. This is a very painful bulge that cannot be reduced. A strangulated hernia is a medical emergency.  What are the causes? This condition is caused by abdominal tissue putting pressure on an area of weakness in the abdominal muscles. What increases the risk? The following factors may make you more likely to develop this condition:  Being male.  Being 60 or older.  Being overweight or obese.  Having had previous abdominal surgery, especially if there was an infection after surgery.  Having had an injury to the abdominal wall.  Having had several pregnancies.  Having a buildup of fluid inside the abdomen (ascites).  What are the signs or symptoms? The only symptom of a ventral  hernia may be a painless bulge in the abdomen. A reducible hernia may be visible only when you strain, cough, or lift. Other symptoms may include:  Dull pain.  A feeling of pressure.  Signs and symptoms of a strangulated hernia may include:  Increasing pain.  Nausea and vomiting.  Pain when pressing on the hernia.  The skin over the hernia turning red or purple.  Constipation.  Blood in the stool (feces).  How is this diagnosed? This condition may be diagnosed based on:  Your symptoms.  Your medical history.  A physical exam. You may be asked to cough or strain while standing. These actions increase the pressure inside your abdomen and force the hernia through the opening in your muscles. Your health care provider may try to reduce the hernia by pressing on it.  Imaging studies, such as an ultrasound or CT scan.  How is this treated? This condition is treated with surgery. If you have a strangulated hernia, surgery is done as soon as possible. If your hernia is small and not incarcerated, you may be asked to lose some weight before surgery. Follow these instructions at home:  Follow instructions from your health care provider about eating or drinking restrictions.  If you are overweight, your health care provider may recommend that you increase your activity level and eat a healthier diet.  Do not lift anything that is heavier than 10 lb (4.5 kg).  Return to your normal activities as told by your health care provider. Ask your health care provider what activities are safe for you. You   may need to avoid activities that increase pressure on your hernia.  Take over-the-counter and prescription medicines only as told by your health care provider.  Keep all follow-up visits as told by your health care provider. This is important. Contact a health care provider if:  Your hernia gets larger.  Your hernia becomes painful. Get help right away if:  Your hernia becomes  increasingly painful.  You have pain along with any of the following: ? Changes in skin color in the area of the hernia. ? Nausea. ? Vomiting. ? Fever. Summary  A ventral hernia is a bulge of tissue from inside the abdomen that pushes through a weak area of the muscles that form the front wall of the abdomen.  This condition is treated with surgery, which may be urgent depending on your hernia.  Do not lift anything that is heavier than 10 lb (4.5 kg), and follow activity instructions from your health care provider. This information is not intended to replace advice given to you by your health care provider. Make sure you discuss any questions you have with your health care provider. Document Released: 03/11/2012 Document Revised: 11/10/2015 Document Reviewed: 11/10/2015 Elsevier Interactive Patient Education  2018 [employer]  

## 2018-01-21 NOTE — H&P (Signed)
Carl Kaufman; [id number]; 11/30/1960   HPI Patient is a 57 year old white male who was referred to my care by Dr. Willey Blade for evaluation treatment of an umbilical hernia.  He has been present for some time.  It seems to be increasing in size and causing discomfort when he talks or lifts something heavy.  He currently has no pain at the umbilical site. Past Medical History:  Diagnosis Date  . Hemorrhoids   . Hyperplastic colon polyp 01/02/15  . Hypertension   . Joint pain   . Sleep apnea    No CPAP at home required  . Tubular adenoma 2011    Past Surgical History:  Procedure Laterality Date  . APPENDECTOMY     20 years ago  . COLONOSCOPY  12/13/2009   Dr.Rourk- normal rectum, diminutive cecal polyp, the remaining of the colonic mucosa appeared normal. bx= tubular adenoma  . COLONOSCOPY N/A 01/02/2015   Dr.Rourk- grade 3 internal hemorrhoids present. nearly circumferential apparent surgical scar distal rectum just proximal to the anal verge and internal hemorrhoidal plexus. no evidence of obvious neoplam or other abnoramlity. the colonic mucosa appeared normal except for one dimimutive polyp in the base of the cecum. bx= cecal-benign bymphoid polyp, rectum- hyperplastic polyp  . foot arthritis Left    scraped   . HEMORRHOID BANDING  2016   Dr.Rourk  . STAPLE HEMORRHOIDECTOMY      Family History  Problem Relation Age of Onset  . Colon cancer Neg Hx     Current Outpatient Medications on File Prior to Visit  Medication Sig Dispense Refill  . lisinopril (PRINIVIL,ZESTRIL) 10 MG tablet Take 10 mg by mouth daily.    . meloxicam (MOBIC) 15 MG tablet Take 15 mg by mouth daily.     No current facility-administered medications on file prior to visit.     No Known Allergies  Social History   Substance and Sexual Activity  Alcohol Use Yes  . Alcohol/week: 0.0 standard drinks   Comment: Only in summer 1-2 drinks 4 days out of the week    Social History   Tobacco Use  Smoking  Status Never Smoker  Smokeless Tobacco Current User  . Types: Snuff  Tobacco Comment   snuff    Review of Systems  Constitutional: Negative.   HENT: Positive for sinus pain.   Eyes: Negative.   Respiratory: Negative.   Cardiovascular: Negative.   Gastrointestinal: Positive for heartburn.  Genitourinary: Negative.   Musculoskeletal: Positive for back pain, joint pain and neck pain.  Skin: Negative.   Neurological: Negative.   Endo/Heme/Allergies: Negative.   Psychiatric/Behavioral: Negative.     Objective   Vitals:   01/20/18 0920  BP: (!) 161/88  Pulse: (!) 55  Resp: 20  Temp: 98.9 F (37.2 C)    Physical Exam  Constitutional: He is oriented to person, place, and time. He appears well-developed and well-nourished. No distress.  HENT:  Head: Normocephalic and atraumatic.  Cardiovascular: Normal rate, regular rhythm and normal heart sounds. Exam reveals no gallop and no friction rub.  No murmur heard. Pulmonary/Chest: Effort normal and breath sounds normal. No stridor. No respiratory distress. He has no wheezes. He has no rales.  Abdominal: Soft. Bowel sounds are normal. He exhibits no distension and no mass. There is no tenderness. There is no guarding. A hernia is present.  Reducible umbilical hernia noted.  Neurological: He is alert and oriented to person, place, and time.  Skin: Skin is warm and dry.  Vitals reviewed.  Dr. Ria Comment notes reviewed Assessment   Umbilical hernia Plan   Patient is scheduled for an umbilical herniorrhaphy with mesh on 02/06/2018.  The risks and benefits of the procedure including bleeding, infection, mesh use, and the possibility of recurrence of the hernia were fully explained to the patient, who gave informed consent.

## 2018-01-21 NOTE — Progress Notes (Signed)
Carl Kaufman; [id number]; 1960-07-25   HPI Patient is a 57 year old white male who was referred to my care by Dr. Willey Blade for evaluation treatment of an umbilical hernia.  He has been present for some time.  It seems to be increasing in size and causing discomfort when he talks or lifts something heavy.  He currently has no pain at the umbilical site. Past Medical History:  Diagnosis Date  . Hemorrhoids   . Hyperplastic colon polyp 01/02/15  . Hypertension   . Joint pain   . Sleep apnea    No CPAP at home required  . Tubular adenoma 2011    Past Surgical History:  Procedure Laterality Date  . APPENDECTOMY     20 years ago  . COLONOSCOPY  12/13/2009   Dr.Rourk- normal rectum, diminutive cecal polyp, the remaining of the colonic mucosa appeared normal. bx= tubular adenoma  . COLONOSCOPY N/A 01/02/2015   Dr.Rourk- grade 3 internal hemorrhoids present. nearly circumferential apparent surgical scar distal rectum just proximal to the anal verge and internal hemorrhoidal plexus. no evidence of obvious neoplam or other abnoramlity. the colonic mucosa appeared normal except for one dimimutive polyp in the base of the cecum. bx= cecal-benign bymphoid polyp, rectum- hyperplastic polyp  . foot arthritis Left    scraped   . HEMORRHOID BANDING  2016   Dr.Rourk  . STAPLE HEMORRHOIDECTOMY      Family History  Problem Relation Age of Onset  . Colon cancer Neg Hx     Current Outpatient Medications on File Prior to Visit  Medication Sig Dispense Refill  . lisinopril (PRINIVIL,ZESTRIL) 10 MG tablet Take 10 mg by mouth daily.    . meloxicam (MOBIC) 15 MG tablet Take 15 mg by mouth daily.     No current facility-administered medications on file prior to visit.     No Known Allergies  Social History   Substance and Sexual Activity  Alcohol Use Yes  . Alcohol/week: 0.0 standard drinks   Comment: Only in summer 1-2 drinks 4 days out of the week    Social History   Tobacco Use  Smoking  Status Never Smoker  Smokeless Tobacco Current User  . Types: Snuff  Tobacco Comment   snuff    Review of Systems  Constitutional: Negative.   HENT: Positive for sinus pain.   Eyes: Negative.   Respiratory: Negative.   Cardiovascular: Negative.   Gastrointestinal: Positive for heartburn.  Genitourinary: Negative.   Musculoskeletal: Positive for back pain, joint pain and neck pain.  Skin: Negative.   Neurological: Negative.   Endo/Heme/Allergies: Negative.   Psychiatric/Behavioral: Negative.     Objective   Vitals:   01/20/18 0920  BP: (!) 161/88  Pulse: (!) 55  Resp: 20  Temp: 98.9 F (37.2 C)    Physical Exam  Constitutional: He is oriented to person, place, and time. He appears well-developed and well-nourished. No distress.  HENT:  Head: Normocephalic and atraumatic.  Cardiovascular: Normal rate, regular rhythm and normal heart sounds. Exam reveals no gallop and no friction rub.  No murmur heard. Pulmonary/Chest: Effort normal and breath sounds normal. No stridor. No respiratory distress. He has no wheezes. He has no rales.  Abdominal: Soft. Bowel sounds are normal. He exhibits no distension and no mass. There is no tenderness. There is no guarding. A hernia is present.  Reducible umbilical hernia noted.  Neurological: He is alert and oriented to person, place, and time.  Skin: Skin is warm and dry.  Vitals reviewed.  Dr. Ria Comment notes reviewed Assessment   Umbilical hernia Plan   Patient is scheduled for an umbilical herniorrhaphy with mesh on 02/06/2018.  The risks and benefits of the procedure including bleeding, infection, mesh use, and the possibility of recurrence of the hernia were fully explained to the patient, who gave informed consent.

## 2018-01-26 NOTE — Patient Instructions (Signed)
Carl Kaufman  01/26/2018     @PREFPERIOPPHARMACY @   Your procedure is scheduled on  02/06/2018.  Report to [facility] at  710   A.M.  Call this number if you have problems the morning of surgery:  [phone]   Remember:  Do not eat or drink after midnight.                      Take these medicines the morning of surgery with A SIP OF WATER  Lisinopril, mobic ( if needed).   Do not wear jewelry, make-up or nail polish.  Do not wear lotions, powders, or perfumes, or deodorant.  Do not shave 48 hours prior to surgery.  Men may shave face and neck.  Do not bring valuables to the hospital.  [facility] is not responsible for any belongings or valuables.  Contacts, dentures or bridgework may not be worn into surgery.  Leave your suitcase in the car.  After surgery it may be brought to your room.  For patients admitted to the hospital, discharge time will be determined by your treatment team.  Patients discharged the day of surgery will not be allowed to drive home.   Name and phone number of your driver:   family Special instructions:  None  Please read over the following fact sheets that you were given. Anesthesia Post-op Instructions and Care and Recovery After Surgery       Open Hernia Repair, Adult Open hernia repair is a surgical procedure to fix a hernia. A hernia occurs when an internal organ or tissue pushes out through a weak spot in the abdominal wall muscles. Hernias commonly occur in the groin and around the navel. Most hernias tend to get worse over time. Often, surgery is done to prevent the hernia from becoming bigger, uncomfortable, or an emergency. Emergency surgery may be needed if abdominal contents get stuck in the opening (incarcerated hernia) or the blood supply gets cut off (strangulated hernia). In an open repair, an incision is made in the abdomen to perform the surgery. Tell a health care provider about:  Any allergies you  have.  All medicines you are taking, including vitamins, herbs, eye drops, creams, and over-the-counter medicines.  Any problems you or family members have had with anesthetic medicines.  Any blood or bone disorders you have.  Any surgeries you have had.  Any medical conditions you have, including any recent cold or flu symptoms.  Whether you are pregnant or may be pregnant. What are the risks? Generally, this is a safe procedure. However, problems may occur, including:  Long-lasting (chronic) pain.  Bleeding.  Infection.  Damage to the testicle. This can cause shrinking or swelling.  Damage to the bladder, blood vessels, intestine, or nerves near the hernia.  Trouble passing urine.  Allergic reactions to medicines.  Return of the hernia.  What happens before the procedure? Staying hydrated Follow instructions from your health care provider about hydration, which may include:  Up to 2 hours before the procedure - you may continue to drink clear liquids, such as water, clear fruit juice, black coffee, and plain tea.  Eating and drinking restrictions Follow instructions from your health care provider about eating and drinking, which may include:  8 hours before the procedure - stop eating heavy meals or foods such as meat, fried foods, or fatty foods.  6 hours before the procedure - stop eating light  meals or foods, such as toast or cereal.  6 hours before the procedure - stop drinking milk or drinks that contain milk.  2 hours before the procedure - stop drinking clear liquids.  Medicines  Ask your health care provider about: ? Changing or stopping your regular medicines. This is especially important if you are taking diabetes medicines or blood thinners. ? Taking medicines such as aspirin and ibuprofen. These medicines can thin your blood. Do not take these medicines before your procedure if your health care provider instructs you not to.  You may be given  antibiotic medicine to help prevent infection. General instructions  You may have blood tests or imaging studies.  Ask your health care provider how your surgical site will be marked or identified.  If you smoke, do not smoke for at least 2 weeks before your procedure or for as long as told by your health care provider.  Let your health care provider know if you develop a cold or any infection before your surgery.  Plan to have someone take you home from the hospital or clinic.  If you will be going home right after the procedure, plan to have someone with you for 24 hours. What happens during the procedure?  To reduce your risk of infection: ? Your health care team will wash or sanitize their hands. ? Your skin will be washed with soap. ? Hair may be removed from the surgical area.  An IV tube will be inserted into one of your veins.  You will be given one or more of the following: ? A medicine to help you relax (sedative). ? A medicine to numb the area (local anesthetic). ? A medicine to make you fall asleep (general anesthetic).  Your surgeon will make an incision over the hernia.  The tissues of the hernia will be moved back into place.  The edges of the hernia may be stitched together.  The opening in the abdominal muscles will be closed with stitches (sutures). Or, your surgeon will place a mesh patch made of manmade (synthetic) material over the opening.  The incision will be closed.  A bandage (dressing) may be placed over the incision. The procedure may vary among health care providers and hospitals. What happens after the procedure?  Your blood pressure, heart rate, breathing rate, and blood oxygen level will be monitored until the medicines you were given have worn off.  You may be given medicine for pain.  Do not drive for 24 hours if you received a sedative. This information is not intended to replace advice given to you by your health care provider. Make  sure you discuss any questions you have with your health care provider. Document Released: 09/18/2000 Document Revised: 10/13/2015 Document Reviewed: 09/06/2015 Elsevier Interactive Patient Education  2018 [place], Adult, Care After These instructions give you information about caring for yourself after your procedure. Your doctor may also give you more specific instructions. If you have problems or questions, contact your doctor. Follow these instructions at home: Surgical cut (incision) care   Follow instructions from your doctor about how to take care of your surgical cut area. Make sure you: ? Wash your hands with soap and water before you change your bandage (dressing). If you cannot use soap and water, use hand sanitizer. ? Change your bandage as told by your doctor. ? Leave stitches (sutures), skin glue, or skin tape (adhesive) strips in place. They may need to stay in  place for 2 weeks or longer. If tape strips get loose and curl up, you may trim the loose edges. Do not remove tape strips completely unless your doctor says it is okay.  Check your surgical cut every day for signs of infection. Check for: ? More redness, swelling, or pain. ? More fluid or blood. ? Warmth. ? Pus or a bad smell. Activity  Do not drive or use heavy machinery while taking prescription pain medicine. Do not drive until your doctor says it is okay.  Until your doctor says it is okay: ? Do not lift anything that is heavier than 10 lb (4.5 kg). ? Do not play contact sports.  Return to your normal activities as told by your doctor. Ask your doctor what activities are safe. General instructions  To prevent or treat having a hard time pooping (constipation) while you are taking prescription pain medicine, your doctor may recommend that you: ? Drink enough fluid to keep your pee (urine) clear or pale yellow. ? Take over-the-counter or prescription medicines. ? Eat foods that are  high in fiber, such as fresh fruits and vegetables, whole grains, and beans. ? Limit foods that are high in fat and processed sugars, such as fried and sweet foods.  Take over-the-counter and prescription medicines only as told by your doctor.  Do not take baths, swim, or use a hot tub until your doctor says it is okay.  Keep all follow-up visits as told by your doctor. This is important. Contact a doctor if:  You develop a rash.  You have more redness, swelling, or pain around your surgical cut.  You have more fluid or blood coming from your surgical cut.  Your surgical cut feels warm to the touch.  You have pus or a bad smell coming from your surgical cut.  You have a fever or chills.  You have blood in your poop (stool).  You have not pooped in 2-3 days.  Medicine does not help your pain. Get help right away if:  You have chest pain or you are short of breath.  You feel light-headed.  You feel weak and dizzy (feel faint).  You have very bad pain.  You throw up (vomit) and your pain is worse. This information is not intended to replace advice given to you by your health care provider. Make sure you discuss any questions you have with your health care provider. Document Released: 04/15/2014 Document Revised: 10/13/2015 Document Reviewed: 09/06/2015 Elsevier Interactive Patient Education  2018 [place] Anesthesia, Adult General anesthesia is the use of medicines to make a person "go to sleep" (be unconscious) for a medical procedure. General anesthesia is often recommended when a procedure:  Is long.  Requires you to be still or in an unusual position.  Is major and can cause you to lose blood.  Is impossible to do without general anesthesia.  The medicines used for general anesthesia are called general anesthetics. In addition to making you sleep, the medicines:  Prevent pain.  Control your blood pressure.  Relax your muscles.  Tell a  health care provider about:  Any allergies you have.  All medicines you are taking, including vitamins, herbs, eye drops, creams, and over-the-counter medicines.  Any problems you or family members have had with anesthetic medicines.  Types of anesthetics you have had in the past.  Any bleeding disorders you have.  Any surgeries you have had.  Any medical conditions you have.  Any history of  heart or lung conditions, such as heart failure, sleep apnea, or chronic obstructive pulmonary disease (COPD).  Whether you are pregnant or may be pregnant.  Whether you use tobacco, alcohol, marijuana, or street drugs.  Any history of Armed forces logistics/support/administrative officer.  Any history of depression or anxiety. What are the risks? Generally, this is a safe procedure. However, problems may occur, including:  Allergic reaction to anesthetics.  Lung and heart problems.  Inhaling food or liquids from your stomach into your lungs (aspiration).  Injury to nerves.  Waking up during your procedure and being unable to move (rare).  Extreme agitation or a state of mental confusion (delirium) when you wake up from the anesthetic.  Air in the bloodstream, which can lead to stroke.  These problems are more likely to develop if you are having a major surgery or if you have an advanced medical condition. You can prevent some of these complications by answering all of your health care provider's questions thoroughly and by following all pre-procedure instructions. General anesthesia can cause side effects, including:  Nausea or vomiting  A sore throat from the breathing tube.  Feeling cold or shivery.  Feeling tired, washed out, or achy.  Sleepiness or drowsiness.  Confusion or agitation.  What happens before the procedure? Staying hydrated Follow instructions from your health care provider about hydration, which may include:  Up to 2 hours before the procedure - you may continue to drink clear liquids,  such as water, clear fruit juice, black coffee, and plain tea.  Eating and drinking restrictions Follow instructions from your health care provider about eating and drinking, which may include:  8 hours before the procedure - stop eating heavy meals or foods such as meat, fried foods, or fatty foods.  6 hours before the procedure - stop eating light meals or foods, such as toast or cereal.  6 hours before the procedure - stop drinking milk or drinks that contain milk.  2 hours before the procedure - stop drinking clear liquids.  Medicines  Ask your health care provider about: ? Changing or stopping your regular medicines. This is especially important if you are taking diabetes medicines or blood thinners. ? Taking medicines such as aspirin and ibuprofen. These medicines can thin your blood. Do not take these medicines before your procedure if your health care provider instructs you not to. ? Taking new dietary supplements or medicines. Do not take these during the week before your procedure unless your health care provider approves them.  If you are told to take a medicine or to continue taking a medicine on the day of the procedure, take the medicine with sips of water. General instructions   Ask if you will be going home the same day, the following day, or after a longer hospital stay. ? Plan to have someone take you home. ? Plan to have someone stay with you for the first 24 hours after you leave the hospital or clinic.  For 3-6 weeks before the procedure, try not to use any tobacco products, such as cigarettes, chewing tobacco, and e-cigarettes.  You may brush your teeth on the morning of the procedure, but make sure to spit out the toothpaste. What happens during the procedure?  You will be given anesthetics through a mask and through an IV tube in one of your veins.  You may receive medicine to help you relax (sedative).  As soon as you are asleep, a breathing tube may be  used to help you  breathe.  An anesthesia specialist will stay with you throughout the procedure. He or she will help keep you comfortable and safe by continuing to give you medicines and adjusting the amount of medicine that you get. He or she will also watch your blood pressure, pulse, and oxygen levels to make sure that the anesthetics do not cause any problems.  If a breathing tube was used to help you breathe, it will be removed before you wake up. The procedure may vary among health care providers and hospitals. What happens after the procedure?  You will wake up, often slowly, after the procedure is complete, usually in a recovery area.  Your blood pressure, heart rate, breathing rate, and blood oxygen level will be monitored until the medicines you were given have worn off.  You may be given medicine to help you calm down if you feel anxious or agitated.  If you will be going home the same day, your health care provider may check to make sure you can stand, drink, and urinate.  Your health care providers will treat your pain and side effects before you go home.  Do not drive for 24 hours if you received a sedative.  You may: ? Feel nauseous and vomit. ? Have a sore throat. ? Have mental slowness. ? Feel cold or shivery. ? Feel sleepy. ? Feel tired. ? Feel sore or achy, even in parts of your body where you did not have surgery. This information is not intended to replace advice given to you by your health care provider. Make sure you discuss any questions you have with your health care provider. Document Released: 07/02/2007 Document Revised: 09/05/2015 Document Reviewed: 03/09/2015 Elsevier Interactive Patient Education  2018 [place] Anesthesia, Adult, Care After These instructions provide you with information about caring for yourself after your procedure. Your health care provider may also give you more specific instructions. Your treatment has been planned  according to current medical practices, but problems sometimes occur. Call your health care provider if you have any problems or questions after your procedure. What can I expect after the procedure? After the procedure, it is common to have:  Vomiting.  A sore throat.  Mental slowness.  It is common to feel:  Nauseous.  Cold or shivery.  Sleepy.  Tired.  Sore or achy, even in parts of your body where you did not have surgery.  Follow these instructions at home: For at least 24 hours after the procedure:  Do not: ? Participate in activities where you could fall or become injured. ? Drive. ? Use heavy machinery. ? Drink alcohol. ? Take sleeping pills or medicines that cause drowsiness. ? Make important decisions or sign legal documents. ? Take care of children on your own.  Rest. Eating and drinking  If you vomit, drink water, juice, or soup when you can drink without vomiting.  Drink enough fluid to keep your urine clear or pale yellow.  Make sure you have little or no nausea before eating solid foods.  Follow the diet recommended by your health care provider. General instructions  Have a responsible adult stay with you until you are awake and alert.  Return to your normal activities as told by your health care provider. Ask your health care provider what activities are safe for you.  Take over-the-counter and prescription medicines only as told by your health care provider.  If you smoke, do not smoke without supervision.  Keep all follow-up visits as told by  your health care provider. This is important. Contact a health care provider if:  You continue to have nausea or vomiting at home, and medicines are not helpful.  You cannot drink fluids or start eating again.  You cannot urinate after 8-12 hours.  You develop a skin rash.  You have fever.  You have increasing redness at the site of your procedure. Get help right away if:  You have  difficulty breathing.  You have chest pain.  You have unexpected bleeding.  You feel that you are having a life-threatening or urgent problem. This information is not intended to replace advice given to you by your health care provider. Make sure you discuss any questions you have with your health care provider. Document Released: 07/01/2000 Document Revised: 08/28/2015 Document Reviewed: 03/09/2015 Elsevier Interactive Patient Education  [employer].

## 2018-02-02 ENCOUNTER — Encounter (HOSPITAL_COMMUNITY)
Admission: RE | Admit: 2018-02-02 | Discharge: 2018-02-02 | Disposition: A | Discharge status: Home / Self Care | Payer: Managed Care, Other (non HMO) | Source: Ambulatory Visit | Attending: General Surgery | Admitting: General Surgery

## 2018-02-02 ENCOUNTER — Other Ambulatory Visit: Payer: Self-pay

## 2018-02-02 ENCOUNTER — Encounter (HOSPITAL_COMMUNITY): Payer: Self-pay

## 2018-02-02 DIAGNOSIS — Z01818 Encounter for other preprocedural examination: Secondary | ICD-10-CM | POA: Diagnosis present

## 2018-02-02 DIAGNOSIS — K429 Umbilical hernia without obstruction or gangrene: Secondary | ICD-10-CM | POA: Insufficient documentation

## 2018-02-02 HISTORY — DX: Unspecified osteoarthritis, unspecified site: M19.90

## 2018-02-02 HISTORY — DX: Family history of other specified conditions: Z84.89

## 2018-02-02 HISTORY — DX: Cardiac murmur, unspecified: R01.1

## 2018-02-02 LAB — CBC WITH DIFFERENTIAL/PLATELET
Abs Immature Granulocytes: 0.03 10*3/uL (ref 0.00–0.07)
Basophils Absolute: 0 10*3/uL (ref 0.0–0.1)
Basophils Relative: 1 %
EOS ABS: 0.1 10*3/uL (ref 0.0–0.5)
EOS PCT: 2 %
HEMATOCRIT: 46.2 % (ref 39.0–52.0)
Hemoglobin: 16.5 g/dL (ref 13.0–17.0)
Immature Granulocytes: 0 %
LYMPHS ABS: 2 10*3/uL (ref 0.7–4.0)
Lymphocytes Relative: 24 %
MCH: 32 pg (ref 26.0–34.0)
MCHC: 35.7 g/dL (ref 30.0–36.0)
MCV: 89.5 fL (ref 80.0–100.0)
MONOS PCT: 9 %
Monocytes Absolute: 0.7 10*3/uL (ref 0.1–1.0)
Neutro Abs: 5.3 10*3/uL (ref 1.7–7.7)
Neutrophils Relative %: 64 %
Platelets: 256 10*3/uL (ref 150–400)
RBC: 5.16 MIL/uL (ref 4.22–5.81)
RDW: 12.5 % (ref 11.5–15.5)
WBC: 8.1 10*3/uL (ref 4.0–10.5)
nRBC: 0 % (ref 0.0–0.2)

## 2018-02-02 LAB — BASIC METABOLIC PANEL
Anion gap: 9 (ref 5–15)
BUN: 13 mg/dL (ref 6–20)
CALCIUM: 9.2 mg/dL (ref 8.9–10.3)
CO2: 23 mmol/L (ref 22–32)
CREATININE: 1.02 mg/dL (ref 0.61–1.24)
Chloride: 105 mmol/L (ref 98–111)
GLUCOSE: 92 mg/dL (ref 70–99)
Potassium: 3.7 mmol/L (ref 3.5–5.1)
Sodium: 137 mmol/L (ref 135–145)

## 2018-02-02 NOTE — Progress Notes (Signed)
   02/02/18 1455  OBSTRUCTIVE SLEEP APNEA  Have you ever been diagnosed with sleep apnea through a sleep study? No  Do you snore loudly (loud enough to be heard through closed doors)?  1  Do you often feel tired, fatigued, or sleepy during the daytime (such as falling asleep during driving or talking to someone)? 0  Has anyone observed you stop breathing during your sleep? 1  Do you have, or are you being treated for high blood pressure? 1  BMI more than 35 kg/m2? 0  Age > 50 (1-yes) 1  Neck circumference greater than:Male 16 inches or larger, Male 17inches or larger? 0  Male Gender (Yes=1) 1  Obstructive Sleep Apnea Score 5  Score 5 or greater  Results sent to PCP

## 2018-02-06 ENCOUNTER — Encounter (HOSPITAL_COMMUNITY)
Admission: RE | Disposition: A | Discharge status: Home / Self Care | Payer: Self-pay | Source: Ambulatory Visit | Attending: General Surgery

## 2018-02-06 ENCOUNTER — Ambulatory Visit (HOSPITAL_COMMUNITY): Payer: Managed Care, Other (non HMO) | Admitting: Anesthesiology

## 2018-02-06 ENCOUNTER — Ambulatory Visit (HOSPITAL_COMMUNITY)
Admission: RE | Admit: 2018-02-06 | Discharge: 2018-02-06 | Disposition: A | Discharge status: Home / Self Care | Payer: Managed Care, Other (non HMO) | Source: Ambulatory Visit | Attending: General Surgery | Admitting: General Surgery

## 2018-02-06 ENCOUNTER — Encounter (HOSPITAL_COMMUNITY): Payer: Self-pay | Admitting: Anesthesiology

## 2018-02-06 DIAGNOSIS — Z720 Tobacco use: Secondary | ICD-10-CM | POA: Diagnosis not present

## 2018-02-06 DIAGNOSIS — Z8719 Personal history of other diseases of the digestive system: Secondary | ICD-10-CM | POA: Diagnosis not present

## 2018-02-06 DIAGNOSIS — I10 Essential (primary) hypertension: Secondary | ICD-10-CM | POA: Diagnosis not present

## 2018-02-06 DIAGNOSIS — K429 Umbilical hernia without obstruction or gangrene: Secondary | ICD-10-CM | POA: Diagnosis not present

## 2018-02-06 DIAGNOSIS — G4730 Sleep apnea, unspecified: Secondary | ICD-10-CM | POA: Insufficient documentation

## 2018-02-06 DIAGNOSIS — Z79899 Other long term (current) drug therapy: Secondary | ICD-10-CM | POA: Diagnosis not present

## 2018-02-06 HISTORY — PX: UMBILICAL HERNIA REPAIR: SHX196

## 2018-02-06 SURGERY — REPAIR, HERNIA, UMBILICAL, ADULT
Anesthesia: General | Site: Abdomen

## 2018-02-06 MED ORDER — HYDROCODONE-ACETAMINOPHEN 7.5-325 MG PO TABS
1.0000 | ORAL_TABLET | Freq: Once | ORAL | Status: AC | PRN
  Administered 2018-02-06: 1 via ORAL
  Filled 2018-02-06: qty 1

## 2018-02-06 MED ORDER — POVIDONE-IODINE 10 % OINT PACKET
TOPICAL_OINTMENT | CUTANEOUS | Status: DC | PRN
  Administered 2018-02-06: 1 via TOPICAL

## 2018-02-06 MED ORDER — ONDANSETRON HCL 4 MG/2ML IJ SOLN
INTRAMUSCULAR | Status: DC | PRN
  Administered 2018-02-06: 4 mg via INTRAVENOUS

## 2018-02-06 MED ORDER — MIDAZOLAM HCL 2 MG/2ML IJ SOLN: 0.5000 mg | Freq: Once | INTRAMUSCULAR | Status: DC | PRN

## 2018-02-06 MED ORDER — LIDOCAINE HCL (PF) 1 % IJ SOLN
INTRAMUSCULAR | Status: AC
  Filled 2018-02-06: qty 5

## 2018-02-06 MED ORDER — LIDOCAINE HCL 1 % IJ SOLN
INTRAMUSCULAR | Status: DC | PRN
  Administered 2018-02-06: 50 mg via INTRADERMAL

## 2018-02-06 MED ORDER — BUPIVACAINE LIPOSOME 1.3 % IJ SUSP
INTRAMUSCULAR | Status: AC
  Filled 2018-02-06: qty 20

## 2018-02-06 MED ORDER — CEFAZOLIN SODIUM-DEXTROSE 2-4 GM/100ML-% IV SOLN
2.0000 g | INTRAVENOUS | Status: AC
  Administered 2018-02-06: 2 g via INTRAVENOUS

## 2018-02-06 MED ORDER — LACTATED RINGERS IV SOLN: INTRAVENOUS | Status: DC

## 2018-02-06 MED ORDER — MIDAZOLAM HCL 2 MG/2ML IJ SOLN
INTRAMUSCULAR | Status: AC
  Filled 2018-02-06: qty 2

## 2018-02-06 MED ORDER — PROMETHAZINE HCL 25 MG/ML IJ SOLN: 6.2500 mg | INTRAMUSCULAR | Status: DC | PRN

## 2018-02-06 MED ORDER — BUPIVACAINE LIPOSOME 1.3 % IJ SUSP
INTRAMUSCULAR | Status: DC | PRN
  Administered 2018-02-06: 17 mL

## 2018-02-06 MED ORDER — MIDAZOLAM HCL 5 MG/5ML IJ SOLN
INTRAMUSCULAR | Status: DC | PRN
  Administered 2018-02-06: 2 mg via INTRAVENOUS

## 2018-02-06 MED ORDER — PROPOFOL 10 MG/ML IV BOLUS
INTRAVENOUS | Status: AC
  Filled 2018-02-06: qty 40

## 2018-02-06 MED ORDER — HYDROMORPHONE HCL 1 MG/ML IJ SOLN
0.2500 mg | INTRAMUSCULAR | Status: DC | PRN
  Administered 2018-02-06 (×2): 0.5 mg via INTRAVENOUS
  Filled 2018-02-06 (×2): qty 0.5

## 2018-02-06 MED ORDER — SODIUM CHLORIDE 0.9 % IR SOLN
Status: DC | PRN
  Administered 2018-02-06: 1

## 2018-02-06 MED ORDER — FENTANYL CITRATE (PF) 100 MCG/2ML IJ SOLN
INTRAMUSCULAR | Status: DC | PRN
  Administered 2018-02-06: 50 ug via INTRAVENOUS
  Administered 2018-02-06: 25 ug via INTRAVENOUS

## 2018-02-06 MED ORDER — KETOROLAC TROMETHAMINE 30 MG/ML IJ SOLN
30.0000 mg | Freq: Once | INTRAMUSCULAR | Status: AC
  Administered 2018-02-06: 30 mg via INTRAVENOUS
  Filled 2018-02-06: qty 1

## 2018-02-06 MED ORDER — LACTATED RINGERS IV SOLN
INTRAVENOUS | Status: DC | PRN
  Administered 2018-02-06: 08:00:00 via INTRAVENOUS
  Administered 2018-02-06: 1000 mL

## 2018-02-06 MED ORDER — PROPOFOL 10 MG/ML IV BOLUS
INTRAVENOUS | Status: DC | PRN
  Administered 2018-02-06: 200 mg via INTRAVENOUS

## 2018-02-06 MED ORDER — HYDROCODONE-ACETAMINOPHEN 5-325 MG PO TABS: 1.0000 | ORAL_TABLET | ORAL | 0 refills | Status: DC | PRN

## 2018-02-06 MED ORDER — CEFAZOLIN SODIUM-DEXTROSE 2-4 GM/100ML-% IV SOLN
INTRAVENOUS | Status: AC
  Filled 2018-02-06: qty 100

## 2018-02-06 MED ORDER — ONDANSETRON HCL 4 MG/2ML IJ SOLN
INTRAMUSCULAR | Status: AC
  Filled 2018-02-06: qty 2

## 2018-02-06 MED ORDER — POVIDONE-IODINE 10 % EX OINT
TOPICAL_OINTMENT | CUTANEOUS | Status: AC
  Filled 2018-02-06: qty 1

## 2018-02-06 MED ORDER — CHLORHEXIDINE GLUCONATE CLOTH 2 % EX PADS: 6.0000 | MEDICATED_PAD | Freq: Once | CUTANEOUS | Status: DC

## 2018-02-06 MED ORDER — FENTANYL CITRATE (PF) 100 MCG/2ML IJ SOLN
INTRAMUSCULAR | Status: AC
  Filled 2018-02-06: qty 2

## 2018-02-06 MED ORDER — EPHEDRINE SULFATE 50 MG/ML IJ SOLN
INTRAMUSCULAR | Status: AC
  Filled 2018-02-06: qty 1

## 2018-02-06 MED ORDER — SODIUM CHLORIDE 0.9 % IJ SOLN
INTRAMUSCULAR | Status: AC
  Filled 2018-02-06: qty 10

## 2018-02-06 SURGICAL SUPPLY — 32 items
BLADE SURG SZ11 CARB STEEL (BLADE) ×3 IMPLANT
CHLORAPREP W/TINT 26ML (MISCELLANEOUS) ×3 IMPLANT
CLOTH BEACON ORANGE TIMEOUT ST (SAFETY) ×3 IMPLANT
COVER LIGHT HANDLE STERIS (MISCELLANEOUS) ×6 IMPLANT
ELECT REM PT RETURN 9FT ADLT (ELECTROSURGICAL) ×3
ELECTRODE REM PT RTRN 9FT ADLT (ELECTROSURGICAL) ×1 IMPLANT
GAUZE SPONGE 2X2 8PLY STRL LF (GAUZE/BANDAGES/DRESSINGS) IMPLANT
GLOVE BIOGEL PI IND STRL 7.0 (GLOVE) ×2 IMPLANT
GLOVE BIOGEL PI INDICATOR 7.0 (GLOVE) ×4
GLOVE SURG SS PI 7.5 STRL IVOR (GLOVE) ×4 IMPLANT
GOWN STRL REUS W/TWL LRG LVL3 (GOWN DISPOSABLE) ×6 IMPLANT
INST SET MINOR GENERAL (KITS) ×3 IMPLANT
KIT TURNOVER KIT A (KITS) ×3 IMPLANT
MANIFOLD NEPTUNE II (INSTRUMENTS) ×3 IMPLANT
MESH VENTRALEX ST 1-7/10 CRC S (Mesh General) ×2 IMPLANT
NDL HYPO 21X1.5 SAFETY (NEEDLE) ×1 IMPLANT
NEEDLE HYPO 21X1.5 SAFETY (NEEDLE) ×3 IMPLANT
NS IRRIG 1000ML POUR BTL (IV SOLUTION) ×3 IMPLANT
PACK MINOR (CUSTOM PROCEDURE TRAY) ×3 IMPLANT
PAD ARMBOARD 7.5X6 YLW CONV (MISCELLANEOUS) ×3 IMPLANT
SET BASIN LINEN APH (SET/KITS/TRAYS/PACK) ×3 IMPLANT
SPONGE GAUZE 2X2 8PLY STER LF (GAUZE/BANDAGES/DRESSINGS) ×2
SPONGE GAUZE 2X2 8PLY STRL LF (GAUZE/BANDAGES/DRESSINGS) ×4 IMPLANT
SPONGE GAUZE 2X2 STER 10/PKG (GAUZE/BANDAGES/DRESSINGS) ×2
STAPLER VISISTAT (STAPLE) ×3 IMPLANT
SUT ETHIBOND NAB MO 7 #0 18IN (SUTURE) ×3 IMPLANT
SUT VIC AB 2-0 CT2 27 (SUTURE) ×3 IMPLANT
SUT VIC AB 3-0 SH 27 (SUTURE) ×3
SUT VIC AB 3-0 SH 27X BRD (SUTURE) ×1 IMPLANT
SUT VICRYL AB 3 0 TIES (SUTURE) IMPLANT
SYR 20CC LL (SYRINGE) ×3 IMPLANT
TAPE CLOTH SURG 4X10 WHT LF (GAUZE/BANDAGES/DRESSINGS) ×2 IMPLANT

## 2018-02-06 NOTE — Transfer of Care (Signed)
Immediate Anesthesia Transfer of Care Note  Patient: Carl Kaufman  Procedure(s) Performed: HERNIA REPAIR UMBILICAL ADULT WITH MESH (N/A Abdomen)  Patient Location: PACU  Anesthesia Type:General  Level of Consciousness: awake and patient cooperative  Airway & Oxygen Therapy: Patient Spontanous Breathing  Post-op Assessment: Report given to RN, Post -op Vital signs reviewed and stable and Patient moving all extremities  Post vital signs: Reviewed and stable  Last Vitals:  Vitals Value Taken Time  BP    Temp    Pulse    Resp    SpO2      Last Pain:  Vitals:   02/06/18 0734  TempSrc: Oral  PainSc: 0-No pain      Patients Stated Pain Goal: 9 (28/40/69 8614)  Complications: No apparent anesthesia complications

## 2018-02-06 NOTE — Op Note (Signed)
Patient:  Carl Kaufman  DOB:  1960-04-24  MRN:  [id number]   Preop Diagnosis: Umbilical hernia  Postop Diagnosis: Same  Procedure: Umbilical herniorrhaphy with mesh  Surgeon: Aviva Signs, MD  Anes: General  Indications: Patient is a 57 year old white male who presents with a symptomatic umbilical hernia.  The risks and benefits of the procedure including bleeding, infection, mesh use, and the possibility of recurrence of the hernia were fully explained to the patient, who gave informed consent.  Procedure note: The patient was placed in supine position.  After general anesthesia was administered, the abdomen was prepped and draped using the usual sterile technique with DuraPrep.  Surgical site confirmation was performed.  An infraumbilical incision was made down to the fascia.  The umbilicus was freed away from the inguinal loading hernia sac.  The patient did have some omentum that was somewhat incarcerated in the hernia.  In order to facilitate reduction, a small amount of it was excised and tied off using a 3-0 Vicryl tied x2.  The omentum was then reduced.  The resulting defect was approximately 1.5 cm in size.  I did palpate the underside of the abdominal wall and no other hernias were appreciated.  A 4.3 cm Bard Ventralax ST patch was then inserted and secured to the fascia using 0 Ethibond interrupted sutures.  The overlying fascia was reapproximated transversely using 0 Ethibond interrupted sutures.  The umbilicus was secured back to the fascia using a 2-0 Vicryl suture.  Subcutaneous layer was reapproximated using 3-0 Vicryl interrupted suture.  Exparel was instilled into the surrounding wound.  The skin was closed using staples.  Betadine ointment and a dry sterile dressing were applied.  All tape and needle counts were correct at the end of the procedure.  The patient was awakened and transferred to PACU in stable condition.  Complications: None  EBL: Minimal  Specimen:  None

## 2018-02-06 NOTE — Anesthesia Preprocedure Evaluation (Signed)
Anesthesia Evaluation  Patient identified by MRN, date of birth, ID band Patient awake  General Assessment Comment:pts mother had ponv and delayed awakening - pt never had any issues   Reviewed: Allergy & Precautions, NPO status , Patient's Chart, lab work & pertinent test results  History of Anesthesia Complications (+) Family history of anesthesia reaction  Airway Mallampati: I  TM Distance: >3 FB Neck ROM: Full    Dental no notable dental hx. (+) Teeth Intact Upper perm bridge  Front L tooth chipped :   Pulmonary sleep apnea and Continuous Positive Airway Pressure Ventilation ,    Pulmonary exam normal breath sounds clear to auscultation       Cardiovascular Exercise Tolerance: Good hypertension, Pt. on medications Normal cardiovascular examI+ Valvular Problems/Murmurs  Rhythm:Regular Rate:Normal  States has a murmer  No limitations -can walk over a mile  Denies recent CP or DOE HR high 40s -50s today  Reports No B Blocker use   Neuro/Psych negative neurological ROS  negative psych ROS   GI/Hepatic Neg liver ROS, GERD  ,OCC GERD  None today  Denies using meds for GERD   Endo/Other  negative endocrine ROS  Renal/GU negative Renal ROS  negative genitourinary   Musculoskeletal  (+) Arthritis , Osteoarthritis,    Abdominal   Peds negative pediatric ROS (+)  Hematology negative hematology ROS (+)   Anesthesia Other Findings   Reproductive/Obstetrics negative OB ROS                             Anesthesia Physical Anesthesia Plan  ASA: II  Anesthesia Plan: General   Post-op Pain Management:    Induction: Intravenous  PONV Risk Score and Plan:   Airway Management Planned: LMA  Additional Equipment:   Intra-op Plan:   Post-operative Plan: Extubation in OR  Informed Consent: I have reviewed the patients History and Physical, chart, labs and discussed the procedure  including the risks, benefits and alternatives for the proposed anesthesia with the patient or authorized representative who has indicated his/her understanding and acceptance.   Dental advisory given  Plan Discussed with: CRNA  Anesthesia Plan Comments: (LMA planned - GETA as needed )        Anesthesia Quick Evaluation

## 2018-02-06 NOTE — Anesthesia Procedure Notes (Signed)
Procedure Name: LMA Insertion Date/Time: 02/06/2018 8:32 AM Performed by: Charmaine Downs, CRNA Pre-anesthesia Checklist: Patient identified, Patient being monitored, Emergency Drugs available, Timeout performed and Suction available Patient Re-evaluated:Patient Re-evaluated prior to induction Oxygen Delivery Method: Circle System Utilized Preoxygenation: Pre-oxygenation with 100% oxygen Induction Type: IV induction Ventilation: Mask ventilation without difficulty LMA: LMA inserted LMA Size: 5.0 Number of attempts: 1 Placement Confirmation: positive ETCO2 and breath sounds checked- equal and bilateral Tube secured with: Tape Dental Injury: Teeth and Oropharynx as per pre-operative assessment

## 2018-02-06 NOTE — Discharge Instructions (Signed)
Open Hernia Repair, Adult, Care After This sheet gives you information about how to care for yourself after your procedure. Your health care provider may also give you more specific instructions. If you have problems or questions, contact your health care provider. What can I expect after the procedure? After the procedure, it is common to have:  Mild discomfort.  Slight bruising.  Minor swelling.  Pain in the abdomen.  Follow these instructions at home: Incision care   Follow instructions from your health care provider about how to take care of your incision area. Make sure you: ? Wash your hands with soap and water before you change your bandage (dressing). If soap and water are not available, use hand sanitizer. ? Change your dressing as told by your health care provider. ? Leave stitches (sutures), skin glue, or adhesive strips in place. These skin closures may need to stay in place for 2 weeks or longer. If adhesive strip edges start to loosen and curl up, you may trim the loose edges. Do not remove adhesive strips completely unless your health care provider tells you to do that.  Check your incision area every day for signs of infection. Check for: ? More redness, swelling, or pain. ? More fluid or blood. ? Warmth. ? Pus or a bad smell. Activity  Do not drive or use heavy machinery while taking prescription pain medicine. Do not drive until your health care provider approves.  Until your health care provider approves: ? Do not lift anything that is heavier than 10 lb (4.5 kg). ? Do not play contact sports.  Return to your normal activities as told by your health care provider. Ask your health care provider what activities are safe. General instructions  To prevent or treat constipation while you are taking prescription pain medicine, your health care provider may recommend that you: ? Drink enough fluid to keep your urine clear or pale yellow. ? Take over-the-counter or  prescription medicines. ? Eat foods that are high in fiber, such as fresh fruits and vegetables, whole grains, and beans. ? Limit foods that are high in fat and processed sugars, such as fried and sweet foods.  Take over-the-counter and prescription medicines only as told by your health care provider.  Do not take tub baths or go swimming until your health care provider approves.  Keep all follow-up visits as told by your health care provider. This is important. Contact a health care provider if:  You develop a rash.  You have more redness, swelling, or pain around your incision.  You have more fluid or blood coming from your incision.  Your incision feels warm to the touch.  You have pus or a bad smell coming from your incision.  You have a fever or chills.  You have blood in your stool (feces).  You have not had a bowel movement in 2-3 days.  Your pain is not controlled with medicine. Get help right away if:  You have chest pain or shortness of breath.  You feel light-headed or feel faint.  You have severe pain.  You vomit and your pain is worse. This information is not intended to replace advice given to you by your health care provider. Make sure you discuss any questions you have with your health care provider. Document Released: 10/12/2004 Document Revised: 10/13/2015 Document Reviewed: 09/06/2015 Elsevier Interactive Patient Education  2018 [place] Anesthesia, Adult, Care After These instructions provide you with information about caring for  yourself after your procedure. Your health care provider may also give you more specific instructions. Your treatment has been planned according to current medical practices, but problems sometimes occur. Call your health care provider if you have any problems or questions after your procedure. What can I expect after the procedure? After the procedure, it is common to have:  Vomiting.  A sore  throat.  Mental slowness.  It is common to feel:  Nauseous.  Cold or shivery.  Sleepy.  Tired.  Sore or achy, even in parts of your body where you did not have surgery.  Follow these instructions at home: For at least 24 hours after the procedure:  Do not: ? Participate in activities where you could fall or become injured. ? Drive. ? Use heavy machinery. ? Drink alcohol. ? Take sleeping pills or medicines that cause drowsiness. ? Make important decisions or sign legal documents. ? Take care of children on your own.  Rest. Eating and drinking  If you vomit, drink water, juice, or soup when you can drink without vomiting.  Drink enough fluid to keep your urine clear or pale yellow.  Make sure you have little or no nausea before eating solid foods.  Follow the diet recommended by your health care provider. General instructions  Have a responsible adult stay with you until you are awake and alert.  Return to your normal activities as told by your health care provider. Ask your health care provider what activities are safe for you.  Take over-the-counter and prescription medicines only as told by your health care provider.  If you smoke, do not smoke without supervision.  Keep all follow-up visits as told by your health care provider. This is important. Contact a health care provider if:  You continue to have nausea or vomiting at home, and medicines are not helpful.  You cannot drink fluids or start eating again.  You cannot urinate after 8-12 hours.  You develop a skin rash.  You have fever.  You have increasing redness at the site of your procedure. Get help right away if:  You have difficulty breathing.  You have chest pain.  You have unexpected bleeding.  You feel that you are having a life-threatening or urgent problem. This information is not intended to replace advice given to you by your health care provider. Make sure you discuss any  questions you have with your health care provider. Document Released: 07/01/2000 Document Revised: 08/28/2015 Document Reviewed: 03/09/2015 Elsevier Interactive Patient Education  [employer].

## 2018-02-06 NOTE — Anesthesia Postprocedure Evaluation (Signed)
Anesthesia Post Note  Patient: Carl Kaufman  Procedure(s) Performed: HERNIA REPAIR UMBILICAL ADULT WITH MESH (N/A Abdomen)  Patient location during evaluation: PACU Anesthesia Type: General Level of consciousness: awake and patient cooperative Pain management: pain level controlled Vital Signs Assessment: post-procedure vital signs reviewed and stable Respiratory status: spontaneous breathing, nonlabored ventilation and respiratory function stable Cardiovascular status: blood pressure returned to baseline Postop Assessment: no apparent nausea or vomiting Anesthetic complications: no     Last Vitals:  Vitals:   02/06/18 0734  BP: (!) 135/104  Pulse: (!) 50  Resp: (!) 24  Temp: 37 C  SpO2: 97%    Last Pain:  Vitals:   02/06/18 0734  TempSrc: Oral  PainSc: 0-No pain                 Zharia Conrow J

## 2018-02-06 NOTE — Interval H&P Note (Signed)
History and Physical Interval Note:  02/06/2018 7:38 AM  Carl Kaufman  has presented today for surgery, with the diagnosis of umbilical hernia  The various methods of treatment have been discussed with the patient and family. After consideration of risks, benefits and other options for treatment, the patient has consented to  Procedure(s): HERNIA REPAIR UMBILICAL ADULT WITH MESH (N/A) as a surgical intervention .  The patient's history has been reviewed, patient examined, no change in status, stable for surgery.  I have reviewed the patient's chart and labs.  Questions were answered to the patient's satisfaction.     Aviva Signs

## 2018-02-09 ENCOUNTER — Encounter (HOSPITAL_COMMUNITY): Payer: Self-pay | Admitting: General Surgery

## 2018-02-12 ENCOUNTER — Ambulatory Visit (INDEPENDENT_AMBULATORY_CARE_PROVIDER_SITE_OTHER): Payer: Self-pay | Admitting: General Surgery

## 2018-02-12 ENCOUNTER — Encounter: Payer: Self-pay | Admitting: General Surgery

## 2018-02-12 VITALS — BP 152/79 | HR 52 | Temp 98.2°F | Resp 16 | Wt 232.6 lb

## 2018-02-12 DIAGNOSIS — Z09 Encounter for follow-up examination after completed treatment for conditions other than malignant neoplasm: Secondary | ICD-10-CM

## 2018-02-12 NOTE — Progress Notes (Signed)
Subjective:     Carl Kaufman  Here for postoperative visit.  Patient doing well.  Denies any incisional pain. Objective:    BP (!) 152/79 (BP Location: Left Arm, Patient Position: Sitting, Cuff Size: Large)   Pulse (!) 52   Temp 98.2 F (36.8 C) (Temporal)   Resp 16   Wt 232 lb 9.6 oz (105.5 kg)   BMI 34.35 kg/m   General:  alert, cooperative and no distress  Abdomen soft, incision healing well.  Staples removed, Steri-Strips applied.     Assessment:    Doing well postoperatively.    Plan:   Gradually increase activity.  May return to work without restrictions on 02/23/2018.  Follow-up here as needed.

## 2018-04-10 IMAGING — DX DG HAND COMPLETE 3+V*R*
3 series · 3 of 3 positions shown · non-contrast
Comparison: None.

CLINICAL DATA: Chronic right hand pain at the fourth
metacarpophalangeal joint.

EXAM:
RIGHT HAND - COMPLETE 3+ VIEW

[hand pa]
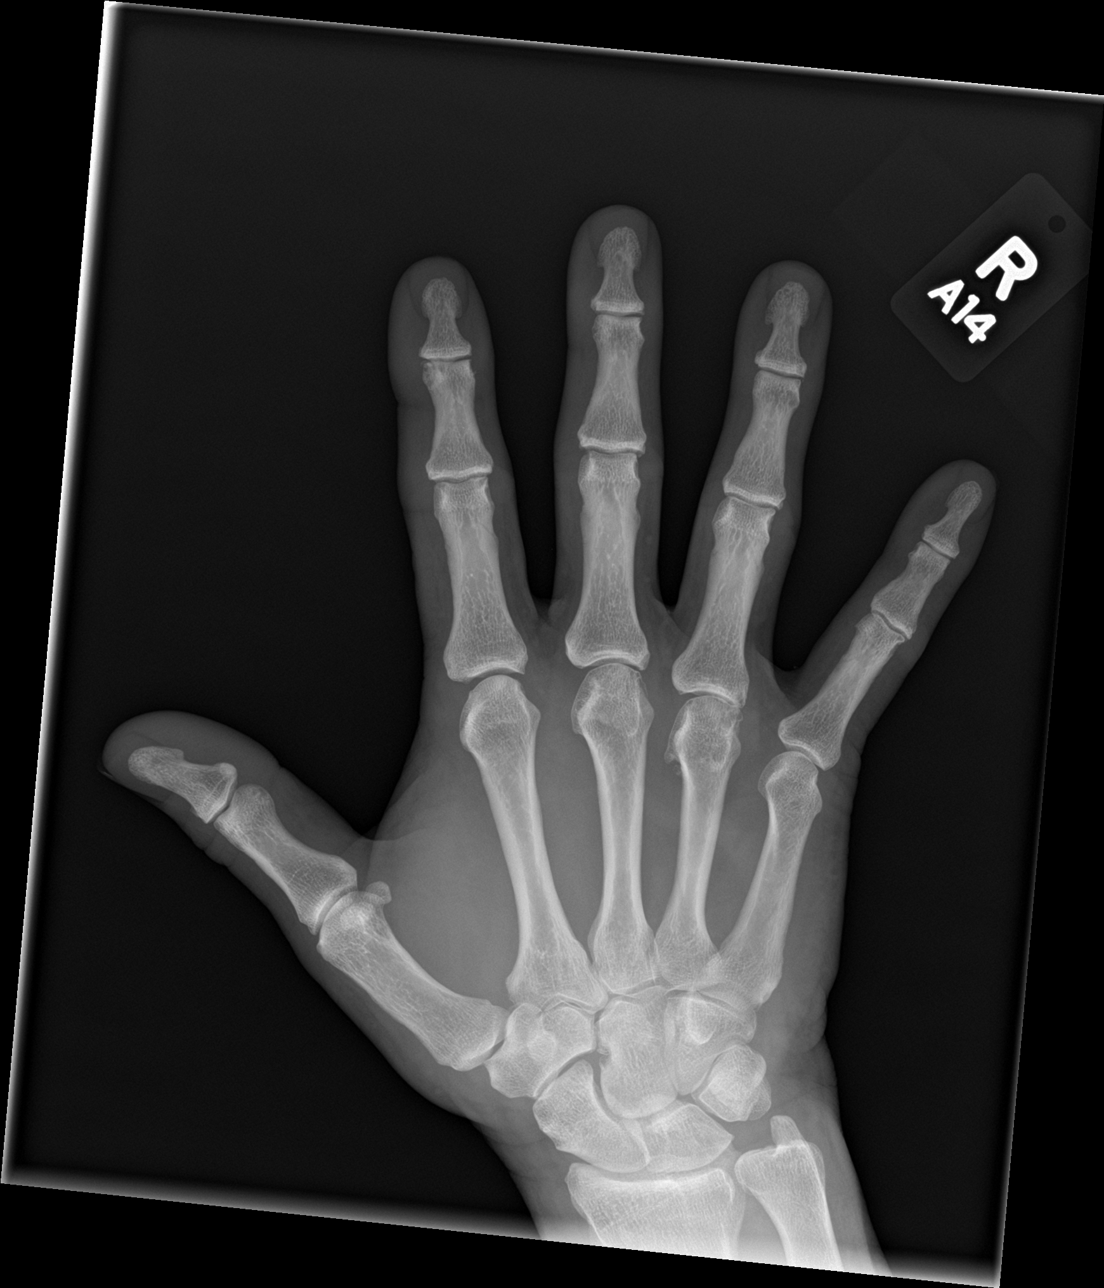

[hand obl]
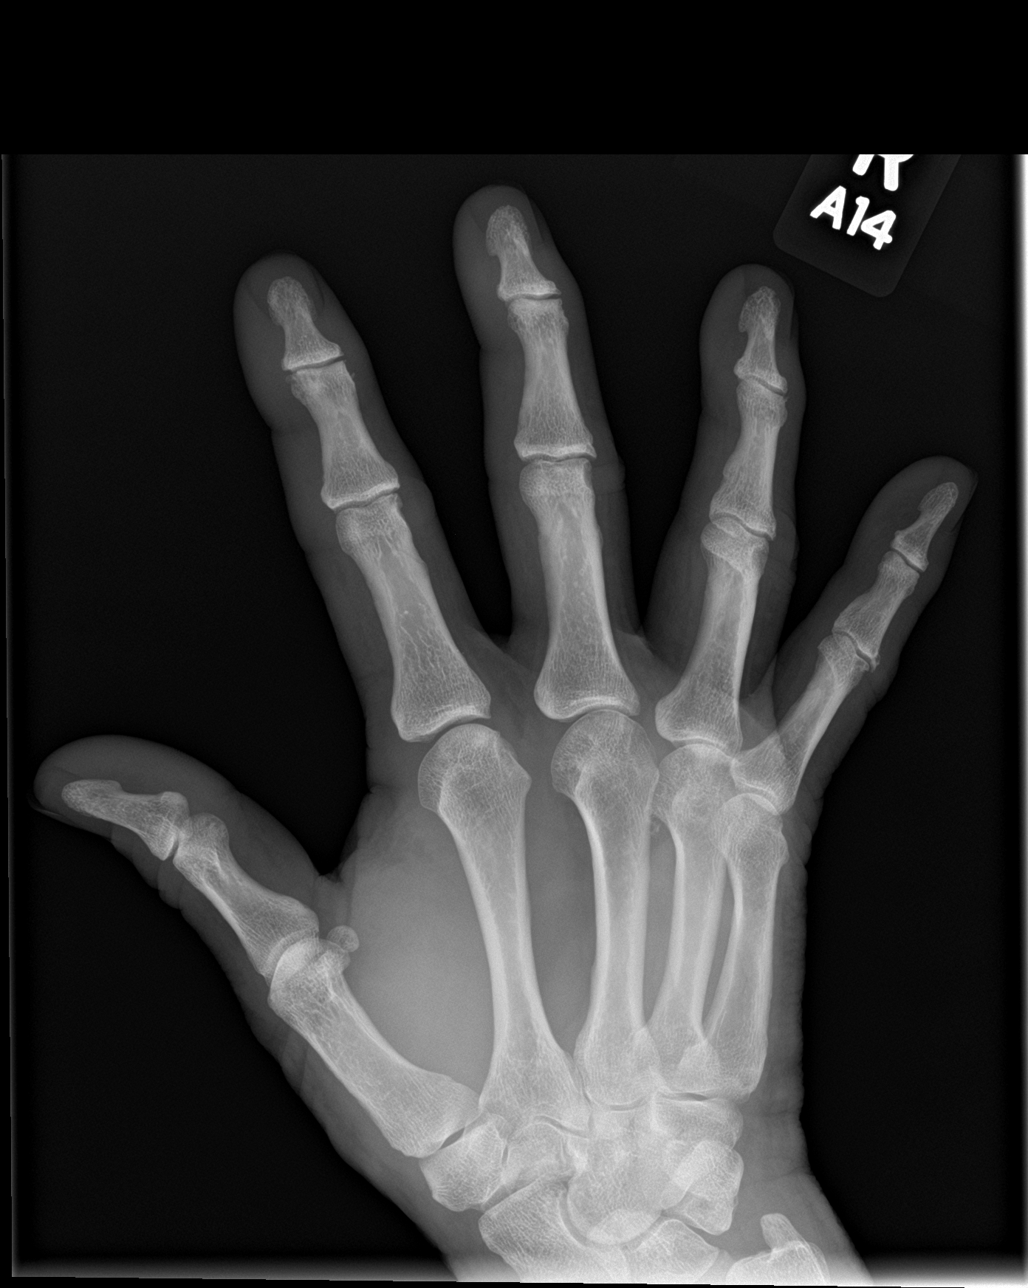

[hand lat]
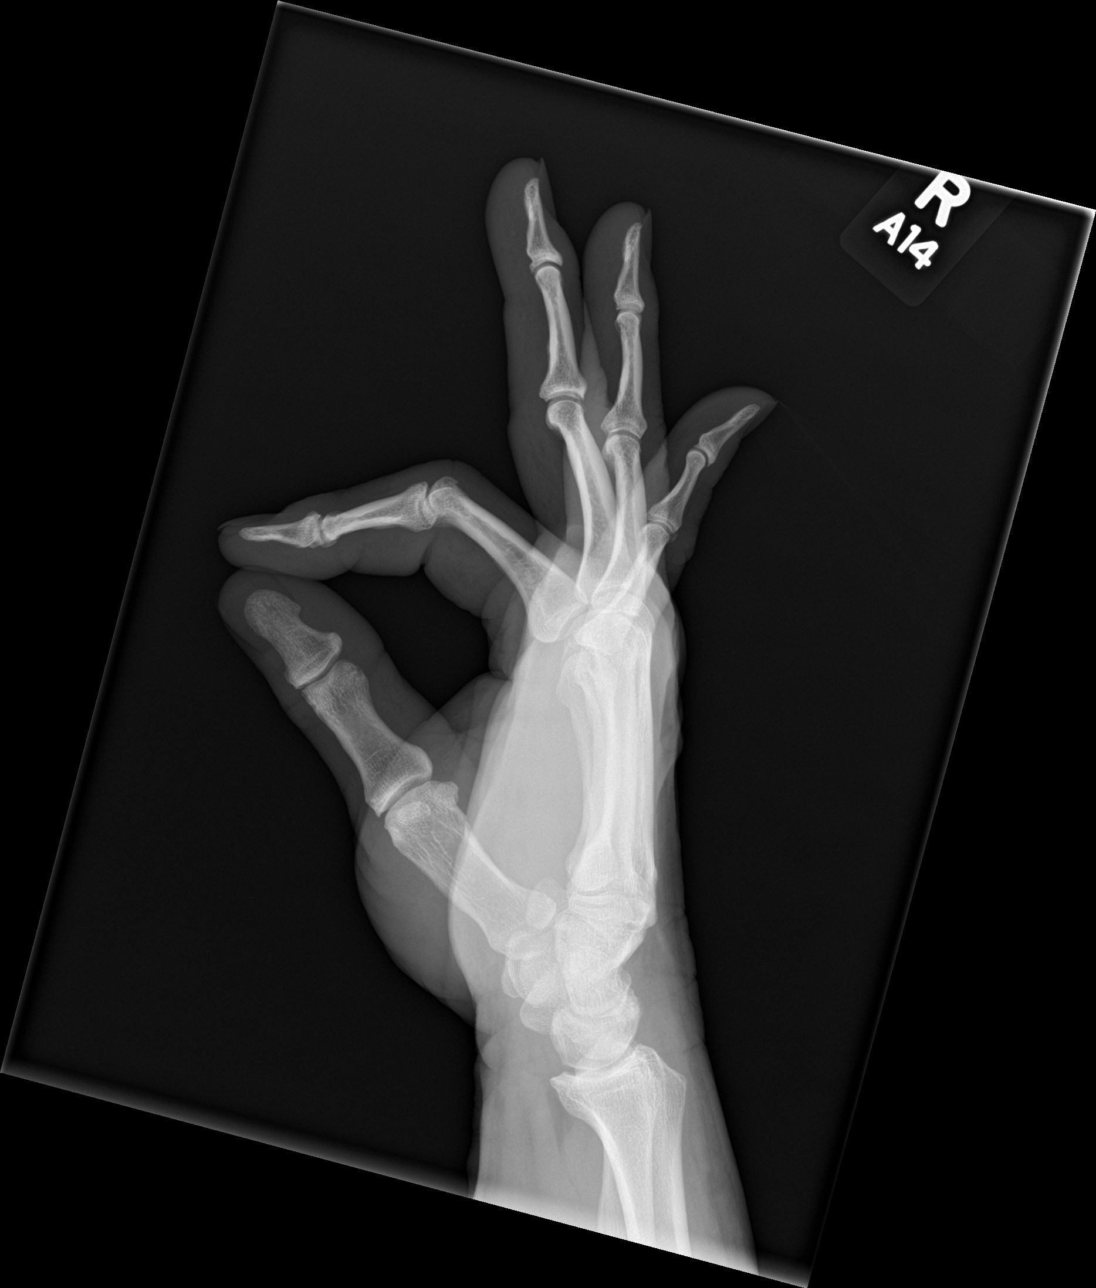

[3 of 3 positions shown; findings below may reference images not displayed]

FINDINGS: There is no evidence of fracture or dislocation. Mild osteoarthritic
changes at the fourth metacarpophalangeal joint with joint space
narrowing, mild remodeling of the head of the fourth metacarpal and
small osteophyte formation. Soft tissues are unremarkable.
IMPRESSION: Mild osteoarthritic changes at the fourth metacarpophalangeal joint.

## 2019-12-15 ENCOUNTER — Encounter: Payer: Self-pay | Admitting: Internal Medicine

## 2020-05-15 ENCOUNTER — Encounter: Payer: Self-pay | Admitting: *Deleted

## 2020-06-05 ENCOUNTER — Ambulatory Visit (INDEPENDENT_AMBULATORY_CARE_PROVIDER_SITE_OTHER): Payer: Self-pay | Admitting: *Deleted

## 2020-06-05 ENCOUNTER — Other Ambulatory Visit: Payer: Self-pay

## 2020-06-05 VITALS — Ht 69.0 in | Wt 236.2 lb

## 2020-06-05 DIAGNOSIS — Z86010 Personal history of colonic polyps: Secondary | ICD-10-CM

## 2020-06-05 MED ORDER — NA SULFATE-K SULFATE-MG SULF 17.5-3.13-1.6 GM/177ML PO SOLN: 1.0000 | Freq: Once | ORAL | 0 refills | Status: AC

## 2020-06-05 NOTE — Progress Notes (Addendum)
Gastroenterology Pre-Procedure Review  Request Date: 06/05/2020 Requesting Physician: Dr. Willey Blade, Last TCS done 2016 by Dr. Gala Romney, lymphoid polyp  PATIENT REVIEW QUESTIONS: The patient responded to the following health history questions as indicated:    1. Diabetes Melitis: no, but pre-diabetic 2. Joint replacements in the past 12 months: no 3. Major health problems in the past 3 months: no 4. Has an artificial valve or MVP: no 5. Has a defibrillator: no 6. Has been advised in past to take antibiotics in advance of a procedure like teeth cleaning: no 7. Family history of colon cancer: no  8. Alcohol Use: yes, 2 beers a day 9. Illicit drug Use: no 10. History of sleep apnea: yes, CPAP  11. History of coronary artery or other vascular stents placed within the last 12 months: no 12. History of any prior anesthesia complications: no 13. Body mass index is 34.88 kg/m.    MEDICATIONS & ALLERGIES:    Patient reports the following regarding taking any blood thinners:   Plavix? no Aspirin? no Coumadin? no Brilinta? no Xarelto? no Eliquis? no Pradaxa? no Savaysa? no Effient? no  Patient confirms/reports the following medications:  Current Outpatient Medications  Medication Sig Dispense Refill  . hydrochlorothiazide (HYDRODIURIL) 25 MG tablet Take 25 mg by mouth daily.    Marland Kitchen lisinopril (ZESTRIL) 40 MG tablet Take 40 mg by mouth daily.    . meloxicam (MOBIC) 15 MG tablet Take 15 mg by mouth daily.    . vitamin C (ASCORBIC ACID) 500 MG tablet Take 500 mg by mouth daily.     No current facility-administered medications for this visit.    Patient confirms/reports the following allergies:  No Known Allergies  No orders of the defined types were placed in this encounter.   AUTHORIZATION INFORMATION Primary Insurance: Unknown Jim #:[id number] ,  Group #: [id number] Pre-Cert / Josem Kaufmann required: No, not required  SCHEDULE INFORMATION: Procedure has been scheduled as follows:  Date:  07/20/2020 Time: AM procedure Location: [facility] with Dr. Gala Romney  This Gastroenterology Pre-Precedure Review Form is being routed to the following provider(s): Walden Field, NP

## 2020-06-05 NOTE — Patient Instructions (Addendum)
Carl Kaufman  27-Sep-1960 MRN: [id number]      Procedure Date: 10/23/2020 Time to register: 7:15 AM   Place to register: [facility] Scheduled provider: Dr. Gala Romney    PREPARATION FOR COLONOSCOPY WITH SUPREP BOWEL PREP KIT  Note: Suprep Bowel Prep Kit is a split-dose (2day) regimen. Consumption of BOTH 6-ounce bottles is required for a complete prep.  Please notify us immediately if you are diabetic, take iron supplements, or if you are on Coumadin or any other blood thinners.  Please hold the following medications: n/a                                                                                                                                                  2 DAYS BEFORE PROCEDURE:  DATE:  10/21/2020  DAY: Saturday Begin clear liquid diet AFTER your lunch meal. NO SOLID FOODS after this point.  1 DAY BEFORE PROCEDURE:  DATE: 10/22/2020   DAY:  Sunday Continue clear liquids the entire day - NO SOLID FOOD.   Diabetic medications adjustments for today: n/a  At 8:00am: Complete steps 1 through 4 below, using ONE (1) 6-ounce bottle, before going to bed. Step 1:  Pour ONE (1) 6-ounce bottle of SUPREP liquid into the mixing container.  Step 2:  Add cool drinking water to the 16 ounce line on the container and mix.  Note: Dilute the solution concentrate as directed prior to use. Step 3:  DRINK ALL the liquid in the container. Step 4:  You MUST drink an additional two (2) or more 16 ounce containers of water over the next one (1) hour.    At 6:00pm: Complete steps 1 through 4 below, using ONE (1) 6-ounce bottle, before going to bed. Step 1:  Pour ONE (1) 6-ounce bottle of SUPREP liquid into the mixing container.  Step 2:  Add cool drinking water to the 16 ounce line on the container and mix.  Note: Dilute the solution concentrate as directed prior to use. Step 3:  DRINK ALL the liquid in the container. Step 4:  You MUST drink an additional two (2) or more 16 ounce  containers of water over the next one (1) hour.    DAY OF PROCEDURE:   DATE: 10/23/2020   DAY: Monday If you take medications for your heart, blood pressure, or breathing, you may take these medications.  Diabetic medications adjustments for today:  Continue clear liquids until 3 hours before procedure.  Nothing by mouth after 5:15 AM.    You may take your morning medications with sip of water unless we have instructed otherwise.    Please see below for Dietary Information.  CLEAR LIQUIDS INCLUDE:  Water Jello (NOT red in color)   Ice Popsicles (NOT red in color)   Tea (sugar ok, no milk/cream) Powdered fruit flavored drinks  Coffee (sugar ok, no milk/cream) Gatorade/ Lemonade/ Kool-Aid  (NOT red in color)   Juice: apple, white grape, white cranberry Soft drinks  Clear bullion, consomme, broth (fat free beef/chicken/vegetable)  Carbonated beverages (any kind)  Strained chicken noodle soup Hard Candy   Remember: Clear liquids are liquids that will allow you to see your fingers on the other side of a clear glass. Be sure liquids are NOT red in color, and not cloudy, but CLEAR.  DO NOT EAT OR DRINK ANY OF THE FOLLOWING:  Dairy products of any kind   Cranberry juice Tomato juice / V8 juice   Grapefruit juice Orange juice     Red grape juice  Do not eat any solid foods, including such foods as: cereal, oatmeal, yogurt, fruits, vegetables, creamed soups, eggs, bread, crackers, pureed foods in a blender, etc.   HELPFUL HINTS FOR DRINKING PREP SOLUTION:   Make sure prep is extremely cold. Mix and refrigerate the the morning of the prep. You may also put in the freezer.   You may try mixing some Crystal Light or Country Time Lemonade if you prefer. Mix in small amounts; add more if necessary.  Try drinking through a straw  Rinse mouth with water or a mouthwash between glasses, to remove after-taste.  Try sipping on a cold beverage /ice/ popsicles between glasses of prep.  Place a  piece of sugar-free hard candy in mouth between glasses.  If you become nauseated, try consuming smaller amounts, or stretch out the time between glasses. Stop for 30-60 minutes, then slowly start back drinking.     OTHER INSTRUCTIONS  You will need a responsible adult at least 62 years of age to accompany you and drive you home. This person must remain in the waiting room during your procedure. The hospital will cancel your procedure if you do not have a responsible adult with you.   1. Wear loose fitting clothing that is easily removed. 2. Leave jewelry and other valuables at home.  3. Remove all body piercing jewelry and leave at home. 4. Total time from sign-in until discharge is approximately 2-3 hours. 5. You should go home directly after your procedure and rest. You can resume normal activities the day after your procedure. 6. The day of your procedure you should not:  Drive  Make legal decisions  Operate machinery  Drink alcohol  Return to work   You may call the office (Dept: [phone]) before 5:00pm, or page the doctor on call [phone]) after 5:00pm, for further instructions, if necessary.   Insurance Information YOU WILL NEED TO CHECK WITH YOUR INSURANCE COMPANY FOR THE BENEFITS OF COVERAGE YOU HAVE FOR THIS PROCEDURE.  UNFORTUNATELY, NOT ALL INSURANCE COMPANIES HAVE BENEFITS TO COVER ALL OR PART OF THESE TYPES OF PROCEDURES.  IT IS YOUR RESPONSIBILITY TO CHECK YOUR BENEFITS, HOWEVER, WE WILL BE GLAD TO ASSIST YOU WITH ANY CODES YOUR INSURANCE COMPANY MAY NEED.    PLEASE NOTE THAT MOST INSURANCE COMPANIES WILL NOT COVER A SCREENING COLONOSCOPY FOR PEOPLE UNDER THE AGE OF 50  IF YOU HAVE BC[employer] INSURANCE, YOU MAY HAVE BENEFITS FOR A SCREENING COLONOSCOPY BUT IF POLYPS ARE FOUND THE DIAGNOSIS WILL CHANGE AND THEN YOU MAY HAVE A DEDUCTIBLE THAT WILL NEED TO BE MET. SO PLEASE MAKE SURE YOU CHECK YOUR BENEFITS FOR A SCREENING COLONOSCOPY AS WELL AS A DIAGNOSTIC  COLONOSCOPY.

## 2020-06-09 NOTE — Progress Notes (Signed)
Spoke with Dr.Rourk, pt drinks 2 beers per day, this may have been overlooked. Last tcs was done with versed 5mg  and demerol 100mg . Per Dr.Rourk, pt does not need office visit but schedule him with propofol. ASA 2.

## 2020-06-09 NOTE — Progress Notes (Signed)
Ok to schedule.  ASA I/II  Please bring CPAP settings to endoscopy in case he needs CPAP in recovery.

## 2020-07-17 ENCOUNTER — Telehealth: Payer: Self-pay | Admitting: *Deleted

## 2020-07-17 NOTE — Telephone Encounter (Addendum)
Called Hoyle Sauer to inform her that pt wanted to cancel his procedure for 07/20/2020.  Left Carolyn a vm.  Tried to call pt but had to leave a vm.  Will get him rescheduled to a June procedure when June procedure schedules have been released.

## 2020-07-17 NOTE — OR Nursing (Signed)
Called patient to go over procedure time. Patient's wife stated they cancelled 2 weeks ago because patient cannot get off of work. Notified Mindy at the office of the above.

## 2020-07-17 NOTE — Telephone Encounter (Signed)
[name] from endo called. Pt cancelled procedure for Thursday bc he can't get off work

## 2020-07-18 ENCOUNTER — Other Ambulatory Visit (HOSPITAL_COMMUNITY)
Admission: RE | Admit: 2020-07-18 | Discharge: 2020-07-18 | Disposition: A | Discharge status: Home / Self Care | Payer: Managed Care, Other (non HMO) | Source: Ambulatory Visit | Attending: Internal Medicine | Admitting: Internal Medicine

## 2020-07-20 ENCOUNTER — Ambulatory Visit (HOSPITAL_COMMUNITY): Admit: 2020-07-20 | Payer: Managed Care, Other (non HMO) | Admitting: Internal Medicine

## 2020-07-20 ENCOUNTER — Encounter (HOSPITAL_COMMUNITY): Payer: Self-pay

## 2020-07-20 SURGERY — COLONOSCOPY WITH PROPOFOL
Anesthesia: Monitor Anesthesia Care

## 2020-09-12 ENCOUNTER — Telehealth: Payer: Self-pay | Admitting: *Deleted

## 2020-09-12 ENCOUNTER — Other Ambulatory Visit: Payer: Self-pay | Admitting: *Deleted

## 2020-09-12 DIAGNOSIS — Z139 Encounter for screening, unspecified: Secondary | ICD-10-CM

## 2020-09-12 NOTE — Telephone Encounter (Signed)
Spoke to pt's wife late yesterday afternoon.  She rescheduled pt's procedure to 10/23/2020 at 8:15 with arrival at 7:15.  She is aware that I will mail out new prep instructions.  Pt voiced understanding.

## 2020-10-20 ENCOUNTER — Telehealth: Payer: Self-pay | Admitting: *Deleted

## 2020-10-20 ENCOUNTER — Other Ambulatory Visit: Payer: Self-pay

## 2020-10-20 ENCOUNTER — Other Ambulatory Visit (HOSPITAL_COMMUNITY)
Admission: RE | Admit: 2020-10-20 | Discharge: 2020-10-20 | Disposition: A | Discharge status: Home / Self Care | Payer: Managed Care, Other (non HMO) | Source: Ambulatory Visit | Attending: Internal Medicine | Admitting: Internal Medicine

## 2020-10-20 DIAGNOSIS — Z139 Encounter for screening, unspecified: Secondary | ICD-10-CM | POA: Insufficient documentation

## 2020-10-20 LAB — BASIC METABOLIC PANEL
Anion gap: 8 (ref 5–15)
BUN: 17 mg/dL (ref 8–23)
CO2: 24 mmol/L (ref 22–32)
Calcium: 9.3 mg/dL (ref 8.9–10.3)
Chloride: 105 mmol/L (ref 98–111)
Creatinine, Ser: 1.13 mg/dL (ref 0.61–1.24)
GFR, Estimated: >60 mL/min (ref >60)
Glucose, Bld: 100 mg/dL — ABNORMAL HIGH (ref 70–99)
Potassium: 3.9 mmol/L (ref 3.5–5.1)
Sodium: 137 mmol/L (ref 135–145)

## 2020-10-20 NOTE — Telephone Encounter (Signed)
Spoke to wife (listed on [place]).  She was made aware that pt needs to have labs drawn today.  She informed me that she would notify him.

## 2020-10-20 NOTE — Telephone Encounter (Signed)
Received call from endo patient has not had his lab work done and he is scheduled for Monday.

## 2020-10-23 ENCOUNTER — Ambulatory Visit (HOSPITAL_COMMUNITY)
Admission: RE | Admit: 2020-10-23 | Discharge: 2020-10-23 | Disposition: A | Discharge status: Home / Self Care | Payer: Managed Care, Other (non HMO) | Attending: Internal Medicine | Admitting: Internal Medicine

## 2020-10-23 ENCOUNTER — Ambulatory Visit (HOSPITAL_COMMUNITY): Payer: Managed Care, Other (non HMO) | Admitting: Anesthesiology

## 2020-10-23 ENCOUNTER — Other Ambulatory Visit: Payer: Self-pay

## 2020-10-23 ENCOUNTER — Encounter (HOSPITAL_COMMUNITY): Payer: Self-pay | Admitting: Internal Medicine

## 2020-10-23 ENCOUNTER — Encounter (HOSPITAL_COMMUNITY)
Admission: RE | Disposition: A | Discharge status: Home / Self Care | Payer: Self-pay | Source: Home / Self Care | Attending: Internal Medicine

## 2020-10-23 DIAGNOSIS — Z86010 Personal history of colonic polyps: Secondary | ICD-10-CM

## 2020-10-23 DIAGNOSIS — Z9049 Acquired absence of other specified parts of digestive tract: Secondary | ICD-10-CM | POA: Insufficient documentation

## 2020-10-23 DIAGNOSIS — K643 Fourth degree hemorrhoids: Secondary | ICD-10-CM | POA: Diagnosis not present

## 2020-10-23 DIAGNOSIS — Z1211 Encounter for screening for malignant neoplasm of colon: Secondary | ICD-10-CM | POA: Insufficient documentation

## 2020-10-23 DIAGNOSIS — F17290 Nicotine dependence, other tobacco product, uncomplicated: Secondary | ICD-10-CM | POA: Insufficient documentation

## 2020-10-23 DIAGNOSIS — K635 Polyp of colon: Secondary | ICD-10-CM

## 2020-10-23 DIAGNOSIS — K644 Residual hemorrhoidal skin tags: Secondary | ICD-10-CM | POA: Insufficient documentation

## 2020-10-23 DIAGNOSIS — D122 Benign neoplasm of ascending colon: Secondary | ICD-10-CM | POA: Diagnosis not present

## 2020-10-23 DIAGNOSIS — Z79899 Other long term (current) drug therapy: Secondary | ICD-10-CM | POA: Diagnosis not present

## 2020-10-23 DIAGNOSIS — Z791 Long term (current) use of non-steroidal anti-inflammatories (NSAID): Secondary | ICD-10-CM | POA: Diagnosis not present

## 2020-10-23 HISTORY — PX: POLYPECTOMY: SHX149

## 2020-10-23 HISTORY — PX: COLONOSCOPY WITH PROPOFOL: SHX5780

## 2020-10-23 SURGERY — COLONOSCOPY WITH PROPOFOL
Anesthesia: General

## 2020-10-23 MED ORDER — STERILE WATER FOR IRRIGATION IR SOLN
Status: DC | PRN
  Administered 2020-10-23 (×2): 200 mL

## 2020-10-23 MED ORDER — LACTATED RINGERS IV SOLN: INTRAVENOUS | Status: DC

## 2020-10-23 MED ORDER — PROPOFOL 10 MG/ML IV BOLUS
INTRAVENOUS | Status: DC | PRN
  Administered 2020-10-23: 40 mg via INTRAVENOUS
  Administered 2020-10-23: 80 mg via INTRAVENOUS
  Administered 2020-10-23: 30 mg via INTRAVENOUS
  Administered 2020-10-23: 50 mg via INTRAVENOUS

## 2020-10-23 NOTE — Discharge Instructions (Addendum)
  Colonoscopy Discharge Instructions  Read the instructions outlined below and refer to this sheet in the next few weeks. These discharge instructions provide you with general information on caring for yourself after you leave the hospital. Your doctor may also give you specific instructions. While your treatment has been planned according to the most current medical practices available, unavoidable complications occasionally occur. If you have any problems or questions after discharge, call Dr. Gala Romney at [phone]. ACTIVITY You may resume your regular activity, but move at a slower pace for the next 24 hours.  Take frequent rest periods for the next 24 hours.  Walking will help get rid of the air and reduce the bloated feeling in your belly (abdomen).  No driving for 24 hours (because of the medicine (anesthesia) used during the test).   Do not sign any important legal documents or operate any machinery for 24 hours (because of the anesthesia used during the test).  NUTRITION Drink plenty of fluids.  You may resume your normal diet as instructed by your doctor.  Begin with a light meal and progress to your normal diet. Heavy or fried foods are harder to digest and may make you feel sick to your stomach (nauseated).  Avoid alcoholic beverages for 24 hours or as instructed.  MEDICATIONS You may resume your normal medications unless your doctor tells you otherwise.  WHAT YOU CAN EXPECT TODAY Some feelings of bloating in the abdomen.  Passage of more gas than usual.  Spotting of blood in your stool or on the toilet paper.  IF YOU HAD POLYPS REMOVED DURING THE COLONOSCOPY: No aspirin products for 7 days or as instructed.  No alcohol for 7 days or as instructed.  Eat a soft diet for the next 24 hours.  FINDING OUT THE RESULTS OF YOUR TEST Not all test results are available during your visit. If your test results are not back during the visit, make an appointment with your caregiver to find out the  results. Do not assume everything is normal if you have not heard from your caregiver or the medical facility. It is important for you to follow up on all of your test results.  SEEK IMMEDIATE MEDICAL ATTENTION IF: You have more than a spotting of blood in your stool.  Your belly is swollen (abdominal distention).  You are nauseated or vomiting.  You have a temperature over 101.  You have abdominal pain or discomfort that is severe or gets worse throughout the day.    1 polyp removed and large hemorrhoids found  OV with [place] in 3 months Roseanne Kaufman)  Dicussed with Neoma Laming [phone]

## 2020-10-23 NOTE — Op Note (Signed)
[facility] Patient Name: Carl Kaufman Procedure Date: 10/23/2020 8:08 AM MRN: [id number] Date of Birth: 03/19/1961 Attending MD: Norvel Richards , MD CSN: [id number] Age: 60 Admit Type: Outpatient Procedure:                Colonoscopy Indications:              High risk colon cancer surveillance: Personal                            history of colonic polyps Providers:                Norvel Richards, MD, Crystal Page, Raphael Gibney, Technician Referring MD:              Medicines:                Propofol per Anesthesia Complications:            No immediate complications. Estimated Blood Loss:     Estimated blood loss was minimal. Procedure:                Pre-Anesthesia Assessment:                           - Prior to the procedure, a History and Physical                            was performed, and patient medications and                            allergies were reviewed. The patient's tolerance of                            previous anesthesia was also reviewed. The risks                            and benefits of the procedure and the sedation                            options and risks were discussed with the patient.                            All questions were answered, and informed consent                            was obtained. Prior Anticoagulants: The patient has                            taken no previous anticoagulant or antiplatelet                            agents. ASA Grade Assessment: II - A patient with  mild systemic disease. After reviewing the risks                            and benefits, the patient was deemed in                            satisfactory condition to undergo the procedure.                           After obtaining informed consent, the colonoscope                            was passed under direct vision. Throughout the                            procedure, the patient's  blood pressure, pulse, and                            oxygen saturations were monitored continuously. The                            CF-[id number] ([id number]) scope was introduced through                            the anus and advanced to the the cecum, identified                            by appendiceal orifice and ileocecal valve. The                            colonoscopy was performed without difficulty. The                            patient tolerated the procedure well. The quality                            of the bowel preparation was adequate. Scope In: 8:33:06 AM Scope Out: 8:42:38 AM Scope Withdrawal Time: 0 hours 7 minutes 58 seconds  Total Procedure Duration: 0 hours 9 minutes 32 seconds  Findings:      External and internal hemorrhoids were found during retroflexion. The       hemorrhoids were severe, large and Grade IV (internal hemorrhoids that       prolapse and cannot be reduced manually). Rectal scar present      A 5 mm polyp was found in the ascending colon. The polyp was sessile.       The polyp was removed with a cold snare. Resection and retrieval were       complete. Estimated blood loss was minimal.      The exam was otherwise without abnormality on direct and retroflexion       views. Impression:               - External and internal hemorrhoids.                           -  One 5 mm polyp in the ascending colon, removed                            with a cold snare. Resected and retrieved.                           - The examination was otherwise normal on direct                            and retroflexion views. Moderate Sedation:      Moderate (conscious) sedation was personally administered by an       anesthesia professional. The following parameters were monitored: oxygen       saturation, heart rate, blood pressure, respiratory rate, EKG, adequacy       of pulmonary ventilation, and response to care. Recommendation:           - Patient has a contact number  available for                            emergencies. The signs and symptoms of potential                            delayed complications were discussed with the                            patient. Return to normal activities tomorrow.                            Written discharge instructions were provided to the                            patient.                           - Resume previous diet.                           - Continue present medications.                           - Repeat colonoscopy date to be determined after                            pending pathology results are reviewed for                            surveillance.                           - Return to GI office in 3 months. Pt with                            significant hemorrhoidal disease not likely                            amenaable to in office  banding as definitive therapy Procedure Code(s):        --- Professional ---                           [phone], Colonoscopy, flexible; with removal of                            tumor(s), polyp(s), or other lesion(s) by snare                            technique Diagnosis Code(s):        --- Professional ---                           Z86.010, Personal history of colonic polyps                           K63.5, Polyp of colon                           K64.3, Fourth degree hemorrhoids CPT copyright 2019 American Medical Association. All rights reserved. The codes documented in this report are preliminary and upon coder review may  be revised to meet current compliance requirements. Cristopher Estimable. Peri Kreft, MD Norvel Richards, MD 10/23/2020 10:01:18 AM This report has been signed electronically. Number of Addenda: 0

## 2020-10-23 NOTE — Anesthesia Postprocedure Evaluation (Signed)
Anesthesia Post Note  Patient: Carl Kaufman  Procedure(s) Performed: COLONOSCOPY WITH PROPOFOL POLYPECTOMY INTESTINAL  Patient location during evaluation: Endoscopy Anesthesia Type: General Level of consciousness: awake and alert and oriented Pain management: pain level controlled Vital Signs Assessment: post-procedure vital signs reviewed and stable Respiratory status: spontaneous breathing and respiratory function stable Cardiovascular status: blood pressure returned to baseline and stable Postop Assessment: no apparent nausea or vomiting Anesthetic complications: no   No notable events documented.   Last Vitals:  Vitals:   10/23/20 0727 10/23/20 0846  BP: 138/83 107/63  Pulse: (!) 53   Resp: 19 15  Temp: (!) 36.4 C 36.6 C  SpO2: 99% 99%    Last Pain:  Vitals:   10/23/20 0846  TempSrc: Oral  PainSc: 0-No pain                 Juliani Laduke C Deanna Boehlke

## 2020-10-23 NOTE — H&P (Signed)
@LOGO @   Primary Care Physician:  Asencion Noble, MD Primary Gastroenterologist:  Dr. Gala Romney  Pre-Procedure History & Physical: HPI:  Carl Kaufman is a 60 y.o. male here for tcs; hx of polyps and positive fm hx CRC mother  Past Medical History:  Diagnosis Date   Arthritis    Family history of adverse reaction to anesthesia    PONV   Heart murmur    Hemorrhoids    Hyperplastic colon polyp 01/02/2015   Hypertension    Joint pain    Sleep apnea    Tubular adenoma 2011    Past Surgical History:  Procedure Laterality Date   APPENDECTOMY     20 years ago   COLONOSCOPY  12/13/2009   Dr.Naz Denunzio- normal rectum, diminutive cecal polyp, the remaining of the colonic mucosa appeared normal. bx= tubular adenoma   COLONOSCOPY N/A 01/02/2015   Dr.Tyresa Prindiville- grade 3 internal hemorrhoids present. nearly circumferential apparent surgical scar distal rectum just proximal to the anal verge and internal hemorrhoidal plexus. no evidence of obvious neoplam or other abnoramlity. the colonic mucosa appeared normal except for one dimimutive polyp in the base of the cecum. bx= cecal-benign bymphoid polyp, rectum- hyperplastic polyp   foot arthritis Left    scraped    HEMORRHOID BANDING  2016   Dr.Zeek Rostron   HERNIA REPAIR Right    inguinal   PLANTAR FASCIA RELEASE Bilateral    STAPLE HEMORRHOIDECTOMY     UMBILICAL HERNIA REPAIR N/A 02/06/2018   Procedure: HERNIA REPAIR UMBILICAL ADULT WITH MESH;  Surgeon: Aviva Signs, MD;  Location: [facility];  Service: General;  Laterality: N/A;  umbilical    Prior to Admission medications   Medication Sig Start Date End Date Taking? Authorizing Provider  cholecalciferol (VITAMIN D) 25 MCG (1000 UNIT) tablet Take 1,000 Units by mouth daily.   Yes [provider]  fluticasone (FLONASE) 50 MCG/ACT nasal spray Place 1 spray into both nostrils at bedtime.   Yes [provider]  hydrochlorothiazide (HYDRODIURIL) 25 MG tablet Take 25 mg by mouth daily.   Yes  [provider]  lisinopril (ZESTRIL) 40 MG tablet Take 40 mg by mouth daily.   Yes [provider]  meloxicam (MOBIC) 15 MG tablet Take 15 mg by mouth daily.   Yes [provider]  vitamin C (ASCORBIC ACID) 500 MG tablet Take 500 mg by mouth daily.   Yes [provider]    Allergies as of 09/12/2020   (No Known Allergies)    Family History  Problem Relation Age of Onset   Colon cancer Neg Hx     Social History   Socioeconomic History   Marital status: Married    Spouse name: Not on file   Number of children: Not on file   Years of education: Not on file   Highest education level: Not on file  Occupational History   Not on file  Tobacco Use   Smoking status: Never   Smokeless tobacco: Current    Types: Snuff   Tobacco comments:    snuff  Vaping Use   Vaping Use: Never used  Substance and Sexual Activity   Alcohol use: Yes    Alcohol/week: 0.0 standard drinks    Comment: Only in summer 1-2 drinks 4 days out of the week   Drug use: No   Sexual activity: Yes    Birth control/protection: None  Other Topics Concern   Not on file  Social History Narrative   Not on file  Social Determinants of Health   Financial Resource Strain: Not on file  Food Insecurity: Not on file  Transportation Needs: Not on file  Physical Activity: Not on file  Stress: Not on file  Social Connections: Not on file  Intimate Partner Violence: Not on file    Review of Systems: See HPI, otherwise negative ROS  Physical Exam: BP 138/83   Pulse (!) 53   Temp (!) 97.5 F (36.4 C) (Oral)   Resp 19   Ht 5\' 9"  (1.753 m)   Wt 101.6 kg   SpO2 99%   BMI 33.08 kg/m  General:   Alert,  Well-developed, well-nourished, pleasant and cooperative in NAD SNeck:  Supple; no masses or thyromegaly. No significant cervical adenopathy. Lungs:  Clear throughout to auscultation.   No wheezes, crackles, or rhonchi. No acute distress. Heart:  Regular rate and rhythm;  no murmurs, clicks, rubs,  or gallops. Abdomen: Non-distended, normal bowel sounds.  Soft and nontender without appreciable mass or hepatosplenomegaly.  Pulses:  Normal pulses noted. Extremities:  Without clubbing or edema.  Impression/Plan: 60 y/o here for tcs as per plan.  tjhe risks, benefits, limitations, alternatives and imponderables have been reviewed with the patient. Questions have been answered. All parties are agreeable.       Notice: This dictation was prepared with Dragon dictation along with smaller phrase technology. Any transcriptional errors that result from this process are unintentional and may not be corrected upon review.

## 2020-10-23 NOTE — Anesthesia Preprocedure Evaluation (Addendum)
Anesthesia Evaluation  Patient identified by MRN, date of birth, ID band Patient awake    Reviewed: Allergy & Precautions, NPO status , Patient's Chart, lab work & pertinent test results  History of Anesthesia Complications (+) Family history of anesthesia reaction and history of anesthetic complications  Airway Mallampati: II  TM Distance: >3 FB Neck ROM: Full    Dental  (+) Dental Advisory Given, Teeth Intact,  Bridge :   Pulmonary sleep apnea and Continuous Positive Airway Pressure Ventilation ,    Pulmonary exam normal breath sounds clear to auscultation       Cardiovascular hypertension, Pt. on medications + Valvular Problems/Murmurs  Rhythm:Regular Rate:Bradycardia + Systolic murmurs    Neuro/Psych negative neurological ROS  negative psych ROS   GI/Hepatic negative GI ROS, Neg liver ROS,   Endo/Other  negative endocrine ROS  Renal/GU negative Renal ROS     Musculoskeletal  (+) Arthritis ,   Abdominal   Peds  Hematology negative hematology ROS (+)   Anesthesia Other Findings   Reproductive/Obstetrics                           Anesthesia Physical Anesthesia Plan  ASA: 2  Anesthesia Plan: General   Post-op Pain Management:    Induction: Intravenous  PONV Risk Score and Plan: Propofol infusion  Airway Management Planned: Nasal Cannula and Natural Airway  Additional Equipment:   Intra-op Plan:   Post-operative Plan:   Informed Consent: I have reviewed the patients History and Physical, chart, labs and discussed the procedure including the risks, benefits and alternatives for the proposed anesthesia with the patient or authorized representative who has indicated his/her understanding and acceptance.     Dental advisory given  Plan Discussed with: CRNA and Surgeon  Anesthesia Plan Comments:        Anesthesia Quick Evaluation

## 2020-10-23 NOTE — Transfer of Care (Signed)
Immediate Anesthesia Transfer of Care Note  Patient: ORVAL DORTCH  Procedure(s) Performed: COLONOSCOPY WITH PROPOFOL POLYPECTOMY INTESTINAL  Patient Location: Endoscopy Unit  Anesthesia Type:General  Level of Consciousness: awake, alert  and oriented  Airway & Oxygen Therapy: Patient Spontanous Breathing  Post-op Assessment: Report given to RN and Post -op Vital signs reviewed and stable  Post vital signs: Reviewed and stable  Last Vitals:  Vitals Value Taken Time  BP    Temp    Pulse 46 10/23/20 0846  Resp    SpO2 96  10/23/20 0846    Last Pain:  Vitals:   10/23/20 0828  TempSrc:   PainSc: 0-No pain      Patients Stated Pain Goal: 8 (94/76/54 6503)  Complications: No notable events documented.

## 2020-10-24 ENCOUNTER — Encounter: Payer: Self-pay | Admitting: Internal Medicine

## 2020-10-24 LAB — SURGICAL PATHOLOGY

## 2020-10-25 ENCOUNTER — Encounter: Payer: Self-pay | Admitting: *Deleted

## 2020-10-27 ENCOUNTER — Encounter (HOSPITAL_COMMUNITY): Payer: Self-pay | Admitting: Internal Medicine

## 2021-02-12 NOTE — Progress Notes (Signed)
Referring Provider: Asencion Noble, MD Primary Care Physician:  Asencion Noble, MD Primary GI: Dr. Gala Romney  Chief Complaint  Patient presents with   Follow-up    Doing okay per pt    HPI:   Carl Kaufman is a 60 y.o. male presenting today with a history of symptomatic hemorrhoids s/p banding in 2016, with recent colonoscopy by Dr. Gala Romney July 2022: external and internal hemorrhoids. One 5 mm polyp. (Tubular adenoma). Grade 4 hemorrhoids.   Returns in follow-up. Notes bleeding, pressure. Thinks he can reduce but comes back. Occasional constipation. Rare straining. Limits toilet time. Interested in banding if possible. Would rather avoid hemorrhoidectomy if he can. Mentioned a tub of cream from [facility] that worked best in the past.   Past Medical History:  Diagnosis Date   Arthritis    Family history of adverse reaction to anesthesia    PONV   Heart murmur    Hemorrhoids    Hyperplastic colon polyp 01/02/2015   Hypertension    Joint pain    Sleep apnea    Tubular adenoma 2011    Past Surgical History:  Procedure Laterality Date   APPENDECTOMY     20 years ago   COLONOSCOPY  12/13/2009   Dr.Rourk- normal rectum, diminutive cecal polyp, the remaining of the colonic mucosa appeared normal. bx= tubular adenoma   COLONOSCOPY N/A 01/02/2015   Dr.Rourk- grade 3 internal hemorrhoids present. nearly circumferential apparent surgical scar distal rectum just proximal to the anal verge and internal hemorrhoidal plexus. no evidence of obvious neoplam or other abnoramlity. the colonic mucosa appeared normal except for one dimimutive polyp in the base of the cecum. bx= cecal-benign bymphoid polyp, rectum- hyperplastic polyp   COLONOSCOPY WITH PROPOFOL N/A 10/23/2020   : external and internal hemorrhoids. One 5 mm polyp. (Tubular adenoma). Grade 4 hemorrhoids.   foot arthritis Left    scraped    HEMORRHOID BANDING  2016   Dr.Rourk   HERNIA REPAIR Right    inguinal   PLANTAR FASCIA  RELEASE Bilateral    POLYPECTOMY  10/23/2020   Procedure: POLYPECTOMY INTESTINAL;  Surgeon: Daneil Dolin, MD;  Location: [facility];  Service: Endoscopy;;   STAPLE HEMORRHOIDECTOMY     UMBILICAL HERNIA REPAIR N/A 02/06/2018   Procedure: HERNIA REPAIR UMBILICAL ADULT WITH MESH;  Surgeon: Aviva Signs, MD;  Location: [facility];  Service: General;  Laterality: N/A;  umbilical    Current Outpatient Medications  Medication Sig Dispense Refill   fluticasone (FLONASE) 50 MCG/ACT nasal spray Place 1 spray into both nostrils at bedtime. As needed     hydrochlorothiazide (HYDRODIURIL) 25 MG tablet Take 25 mg by mouth daily.     lisinopril (ZESTRIL) 40 MG tablet Take 40 mg by mouth daily.     meloxicam (MOBIC) 15 MG tablet Take 15 mg by mouth daily.     cholecalciferol (VITAMIN D) 25 MCG (1000 UNIT) tablet Take 1,000 Units by mouth daily. (Patient not taking: Reported on 02/13/2021)     vitamin C (ASCORBIC ACID) 500 MG tablet Take 500 mg by mouth daily. (Patient not taking: Reported on 02/13/2021)     No current facility-administered medications for this visit.    Allergies as of 02/13/2021   (No Known Allergies)    Family History  Problem Relation Age of Onset   Colon cancer Neg Hx     Social History   Socioeconomic History   Marital status: Married    Spouse name:  Not on file   Number of children: Not on file   Years of education: Not on file   Highest education level: Not on file  Occupational History   Not on file  Tobacco Use   Smoking status: Never   Smokeless tobacco: Current    Types: Snuff   Tobacco comments:    snuff  Vaping Use   Vaping Use: Never used  Substance and Sexual Activity   Alcohol use: Yes    Alcohol/week: 0.0 standard drinks    Comment: Only in summer 1-2 drinks 4 days out of the week   Drug use: No   Sexual activity: Yes    Birth control/protection: None  Other Topics Concern   Not on file  Social History Narrative   Not on file   Social  Determinants of Health   Financial Resource Strain: Not on file  Food Insecurity: Not on file  Transportation Needs: Not on file  Physical Activity: Not on file  Stress: Not on file  Social Connections: Not on file    Review of Systems: Gen: Denies fever, chills, anorexia. Denies fatigue, weakness, weight loss.  CV: Denies chest pain, palpitations, syncope, peripheral edema, and claudication. Resp: Denies dyspnea at rest, cough, wheezing, coughing up blood, and pleurisy. GI: see HPI Derm: Denies rash, itching, dry skin Psych: Denies depression, anxiety, memory loss, confusion. No homicidal or suicidal ideation.  Heme: Denies bruising, bleeding, and enlarged lymph nodes.  Physical Exam: BP 136/79   Pulse (!) 48   Temp 97.7 F (36.5 C)   Ht 5\' 9"  (1.753 m)   Wt 230 lb 12.8 oz (104.7 kg)   BMI 34.08 kg/m  General:   Alert and oriented. No distress noted. Pleasant and cooperative.  Head:  Normocephalic and atraumatic. Eyes:  Conjuctiva clear without scleral icterus. Mouth:  Oral mucosa pink and moist. Good dentition. No lesions. Rectal: protruding enlarged external and internal hemorrhoids, Grade 3/4. Unable to reduce right anterior/posterior columns. Slight reduction in left lateral position but difficult.  Msk:  Symmetrical without gross deformities. Normal posture. Extremities:  Without edema. Neurologic:  Alert and  oriented x4 Psych:  Alert and cooperative. Normal mood and affect.  ASSESSMENT: Carl Kaufman is a 60 y.o. male presenting today with history of symptomatic Grade 3/4 hemorrhoids. Recent colonoscopy on file. Rectal exam performed today to see if we could potentially pursue banding if dealing with Grade 3 predominantly. However, he has notable hemorrhoids and unable to reduce all but left lateral column, which is not completely reducible. I discussed that this is past the benefit of outpatient banding criteria. Discussed surgical referral, which he would like to  avoid.  Recommend avoidance of constipation, limiting toilet time, and no straining. He does mention a cream in a "jar" from [place] that was helpful many years ago. I called [facility] and was unable to find reference for this. However, I have called in the compounded hemorrhoid cream, as this could help supportively.   PLAN:  Called in prescription of compounded hemorrhoid cream with lidocaine to [facility] that patient reports using before that has been helpful.   Call if would like surgical referral  Colonoscopy in 5 years  Return prn  Annitta Needs, PhD, [facility] Gastroenterology

## 2021-02-13 ENCOUNTER — Other Ambulatory Visit: Payer: Self-pay

## 2021-02-13 ENCOUNTER — Encounter: Payer: Self-pay | Admitting: Gastroenterology

## 2021-02-13 ENCOUNTER — Ambulatory Visit (INDEPENDENT_AMBULATORY_CARE_PROVIDER_SITE_OTHER): Payer: Managed Care, Other (non HMO) | Admitting: Gastroenterology

## 2021-02-13 VITALS — BP 136/79 | HR 48 | Temp 97.7°F | Ht 69.0 in | Wt 230.8 lb

## 2021-02-13 DIAGNOSIS — K643 Fourth degree hemorrhoids: Secondary | ICD-10-CM

## 2021-02-13 NOTE — Patient Instructions (Signed)
I have called in a compounded cream to [facility].  Please call if you would like a referral to a surgeon. I am sorry that we can't help from an outpatient banding standpoint!  Your next colonoscopy will be in 5 years.  We will see you as needed!  It was a pleasure to see you today. I want to create trusting relationships with patients to provide genuine, compassionate, and quality care. I value your feedback. If you receive a survey regarding your visit,  I greatly appreciate you taking time to fill this out.   Annitta Needs, PhD, ANP-BC [facility] Gastroenterology

## 2021-06-01 ENCOUNTER — Ambulatory Visit (INDEPENDENT_AMBULATORY_CARE_PROVIDER_SITE_OTHER): Payer: Managed Care, Other (non HMO) | Admitting: Primary Care

## 2021-06-01 ENCOUNTER — Other Ambulatory Visit: Payer: Self-pay

## 2021-06-01 ENCOUNTER — Encounter: Payer: Self-pay | Admitting: Primary Care

## 2021-06-01 VITALS — BP 120/76 | HR 61 | Ht 69.0 in | Wt 229.0 lb

## 2021-06-01 DIAGNOSIS — Z8669 Personal history of other diseases of the nervous system and sense organs: Secondary | ICD-10-CM | POA: Diagnosis not present

## 2021-06-01 DIAGNOSIS — G4700 Insomnia, unspecified: Secondary | ICD-10-CM

## 2021-06-01 DIAGNOSIS — J441 Chronic obstructive pulmonary disease with (acute) exacerbation: Secondary | ICD-10-CM | POA: Diagnosis not present

## 2021-06-01 MED ORDER — TRAZODONE HCL 50 MG PO TABS: 50.0000 mg | ORAL_TABLET | Freq: Every evening | ORAL | 2 refills | Status: DC | PRN

## 2021-06-01 NOTE — Assessment & Plan Note (Signed)
-   Patient reports having difficulty maintaining sleep despite cpap use. Sending in Rx Trazodone 50mg  at bedtime prn insomnia.

## 2021-06-01 NOTE — Progress Notes (Signed)
Reviewed and agree with assessment/plan.   Chesley Mires, MD [facility] 06/01/2021, 11:19 AM Pager:  [phone]

## 2021-06-01 NOTE — Assessment & Plan Note (Signed)
-   Patient has hx sleep apnea. He has symptoms of loud snoring, restless sleep and daytime sleepiness. Epworth 20. BMI 33. Concern patient could have obstructive sleep apnea, needs split night sleep study. Discussed risk of untreated sleep apnea including cardiac arrhythmias, pulm HTN, stroke, DM. We briefly reviewed treatment options. Encourage weight loss and side sleeping position. Recommend he continue to wear current CPAP, he will likely need an order for new machine after sleep study has been completed. Advised against driving if experiencing excessive daytime sleepiness. Follow-up in 4-6 weeks to review sleep study results and discuss treatment options further.

## 2021-06-01 NOTE — Progress Notes (Signed)
@Patient  ID: Carl Kaufman, male    DOB: 01/09/1961, 61 y.o.   MRN: [id number]  Chief Complaint  Patient presents with   Snoring    Apneic episode at hours of sleep     Referring provider: Asencion Noble, MD  HPI: 61 year old male, never smoked.  Past medical history significant for hemorrhoids, history of colon polyp, umbilical hernia.  06/01/2021 Patient presents today for sleep consult. He has hx sleep apnea, he was previously on CPAP. States that he had a repeat sleep study a [facility] 11 years ago (results are not in chart) and that he only passed because he spent the whole time sleeping on his side. His sleeping symptoms have worsened recently. He has symptoms of snoring, restless sleep and daytime sleepiness.  He has been using his mother in laws machine and states that he can not sleep without it. Current CPAP machine is > 60 years old, he is unsure pressure settings. This machine is not registered to him or managed by a provider. He typically goes to bed at 7:30pm and will often wake up 11-11:30pm and is not able to fall back to sleep. He starts his day at 3:30am. He has a lot of daytime sleepiness despite compliance with cpap.   Sleep questionnaire Symptoms- snoring, witnessed apnea, restless sleep, daytime sleepiness Prior sleep study- Hx OSA, 2011 Bedtime- 7:30pm Time to fall asleep -15 mins  Nocturnal awakenings- 5+ times  Out of bed/started today- 3:30am Weight changes- stable Epworth- 20   No Known Allergies   There is no immunization history on file for this patient.  Past Medical History:  Diagnosis Date   Arthritis    Family history of adverse reaction to anesthesia    PONV   Heart murmur    Hemorrhoids    Hyperplastic colon polyp 01/02/2015   Hypertension    Joint pain    Sleep apnea    Tubular adenoma 2011    Tobacco History: Social History   Tobacco Use  Smoking Status Never  Smokeless Tobacco Current   Types: Snuff  Tobacco Comments   snuff    Ready to quit: Not Answered Counseling given: Not Answered Tobacco comments: snuff   Outpatient Medications Prior to Visit  Medication Sig Dispense Refill   cholecalciferol (VITAMIN D) 25 MCG (1000 UNIT) tablet Take 1,000 Units by mouth daily. (Patient not taking: Reported on 02/13/2021)     fluticasone (FLONASE) 50 MCG/ACT nasal spray Place 1 spray into both nostrils at bedtime. As needed     hydrochlorothiazide (HYDRODIURIL) 25 MG tablet Take 25 mg by mouth daily.     lisinopril (ZESTRIL) 40 MG tablet Take 40 mg by mouth daily.     meloxicam (MOBIC) 15 MG tablet Take 15 mg by mouth daily.     vitamin C (ASCORBIC ACID) 500 MG tablet Take 500 mg by mouth daily. (Patient not taking: Reported on 02/13/2021)     No facility-administered medications prior to visit.    Review of Systems  Review of Systems  Constitutional:  Positive for fatigue.  HENT: Negative.    Respiratory: Negative.    Psychiatric/Behavioral:  Positive for sleep disturbance.     Physical Exam  BP 120/76    Pulse 61    Ht 5\' 9"  (1.753 m)    Wt 229 lb (103.9 kg)    SpO2 98%    BMI 33.82 kg/m  Physical Exam Constitutional:      Appearance: Normal appearance.  HENT:  Head: Normocephalic and atraumatic.     Mouth/Throat:     Mouth: Mucous membranes are moist.     Pharynx: Oropharynx is clear.  Eyes:     Conjunctiva/sclera: Conjunctivae normal.     Pupils: Pupils are equal, round, and reactive to light.  Cardiovascular:     Rate and Rhythm: Normal rate and regular rhythm.  Pulmonary:     Effort: Pulmonary effort is normal.     Breath sounds: Normal breath sounds.  Musculoskeletal:        General: Normal range of motion.  Skin:    General: Skin is warm and dry.  Neurological:     General: No focal deficit present.     Mental Status: He is alert. Mental status is at baseline.  Psychiatric:        Mood and Affect: Mood normal.        Behavior: Behavior normal.        Thought Content: Thought content  normal.        Judgment: Judgment normal.     Lab Results:  CBC    Component Value Date/Time   WBC 8.1 02/02/2018 1509   RBC 5.16 02/02/2018 1509   HGB 16.5 02/02/2018 1509   HCT 46.2 02/02/2018 1509   PLT 256 02/02/2018 1509   MCV 89.5 02/02/2018 1509   MCH 32.0 02/02/2018 1509   MCHC 35.7 02/02/2018 1509   RDW 12.5 02/02/2018 1509   LYMPHSABS 2.0 02/02/2018 1509   MONOABS 0.7 02/02/2018 1509   EOSABS 0.1 02/02/2018 1509   BASOSABS 0.0 02/02/2018 1509    BMET    Component Value Date/Time   NA 137 10/20/2020 1258   K 3.9 10/20/2020 1258   CL 105 10/20/2020 1258   CO2 24 10/20/2020 1258   GLUCOSE 100 (H) 10/20/2020 1258   BUN 17 10/20/2020 1258   CREATININE 1.13 10/20/2020 1258   CALCIUM 9.3 10/20/2020 1258   GFRNONAA >60 10/20/2020 1258   GFRAA >60 02/02/2018 1509    BNP No results found for: BNP  ProBNP No results found for: PROBNP  Imaging: No results found.   Assessment & Plan:   Hx of sleep apnea - Patient has hx sleep apnea. He has symptoms of loud snoring, restless sleep and daytime sleepiness. Epworth 20. BMI 33. Concern patient could have obstructive sleep apnea, needs split night sleep study. Discussed risk of untreated sleep apnea including cardiac arrhythmias, pulm HTN, stroke, DM. We briefly reviewed treatment options. Encourage weight loss and side sleeping position. Recommend he continue to wear current CPAP, he will likely need an order for new machine after sleep study has been completed. Advised against driving if experiencing excessive daytime sleepiness. Follow-up in 4-6 weeks to review sleep study results and discuss treatment options further.   Insomnia - Patient reports having difficulty maintaining sleep despite cpap use. Sending in Rx Trazodone 50mg  at bedtime prn insomnia.    Martyn Ehrich, NP 06/01/2021

## 2021-06-01 NOTE — Patient Instructions (Addendum)
°  Sleep apnea is defined as period of 10 seconds or longer when you stop breathing at night. This can happen multiple times a night. Dx sleep apnea is when this occurs more than 5 times an hour.   Mild OSA 5-15 apneic events an hour Moderate OSA 15-30 apneic events an hour Severe OSA > 30 apneic events an hour  Untreated sleep apnea puts you at higher risk for cardiac arrhythmias, pulmonary HTN, stroke and diabetes  Treatment options include weight loss, side sleeping position, oral appliance, CPAP therapy or referral to ENT for possible surgical options   Recommendations: Continue to wear CPAP that you have  Focus on side sleeping position Maintain normal BMI, work on weight loss  Do not drive if experiencing excessive daytime sleepiness of fatigue  Start trazodone 1/2 tab at bedtime, then if tolerating increase 1 tab at night for insomnia   Orders: Split night sleep study re: hx OSA, snoring   Rx: Trazodone 50mg  at bedtime   Follow-up: 4-6 week visit to review sleep study results and discuss treatment options further

## 2021-07-27 ENCOUNTER — Encounter: Payer: Managed Care, Other (non HMO) | Admitting: Neurology

## 2021-07-30 NOTE — Telephone Encounter (Signed)
Beth, please see pt's email regarding the Trazodone. Pt requesting something different. Thanks.  ?

## 2021-08-02 NOTE — Telephone Encounter (Signed)
Ok to stop trazodone. Is he having trouble falling or stay asleep?

## 2021-08-06 ENCOUNTER — Telehealth: Payer: Self-pay | Admitting: Primary Care

## 2021-08-06 NOTE — Telephone Encounter (Signed)
I called the patient and the wife and they want the sleep study preferred in the next week or so. Please advise.  ?

## 2021-08-06 NOTE — Telephone Encounter (Signed)
Did he have sleep study that was ordered back in feb? This needs to be done. He can try melatonin 5-'10mg'$  or diphenhydramine '50mg'$  at bedtime (both are over the counter).

## 2021-08-06 NOTE — Telephone Encounter (Signed)
I have reached out the patient and he was given a date for the sleep study and then got a call that he would need to re-schedule and he has not got a call. I have left a message for PCC's to get him scheduled.  ? ?He has tried all the OTC with no success and wants something else to help him sleep. Please advise.  ?

## 2021-08-06 NOTE — Telephone Encounter (Signed)
See other note. Closing encounter.  

## 2021-08-24 NOTE — Telephone Encounter (Signed)
Trazodone is listed as an allergy. I don't typically prescribe sleep aids until sleep study is completed as they can worsen sleep apnea if he has it. We could try something called atarax which is a prescription antihistamine that is often used for treatment of insomnia and/or anxiety. If he would like to try this please send in RX for atarax (hydroxyzine) '25mg'$  at bedtime   CC: Rodena Piety to check on scheduling sleep study

## 2021-08-24 NOTE — Telephone Encounter (Addendum)
I have initiated another PA request.  In lab sleep is approximately 4-6 weeks out for scheduling.

## 2021-08-28 NOTE — Telephone Encounter (Signed)
Rec'd auth & pt has been scheduled at [facility] 09/17/21.  Pt has been given appt info.  Nothing further needed at this thime.

## 2021-09-17 ENCOUNTER — Encounter: Payer: Managed Care, Other (non HMO) | Admitting: Pulmonary Disease

## 2021-10-12 ENCOUNTER — Ambulatory Visit: Payer: Managed Care, Other (non HMO) | Attending: Primary Care | Admitting: Pulmonary Disease

## 2021-10-12 DIAGNOSIS — G4733 Obstructive sleep apnea (adult) (pediatric): Secondary | ICD-10-CM | POA: Diagnosis present

## 2021-10-12 DIAGNOSIS — Z8669 Personal history of other diseases of the nervous system and sense organs: Secondary | ICD-10-CM

## 2021-10-17 DIAGNOSIS — G4733 Obstructive sleep apnea (adult) (pediatric): Secondary | ICD-10-CM | POA: Diagnosis not present

## 2021-10-17 DIAGNOSIS — Z8669 Personal history of other diseases of the nervous system and sense organs: Secondary | ICD-10-CM | POA: Diagnosis not present

## 2021-10-17 NOTE — Procedures (Signed)
Patient Name: Carl Kaufman, Carl Kaufman Date: 10/12/2021 Gender: Male D.O.B: [birth year] Age (years): 71 Referring Provider: Geraldo Pitter NP Height (inches): 37 Interpreting Physician: Chesley Mires MD, ABSM Weight (lbs): 230 RPSGT: Peak, Robert BMI: 34 MRN: [id number] Neck Size: 17.50  CLINICAL INFORMATION Sleep Study Type: Split Night CPAP  Indication for sleep study: Snoring, sleep disruption, and daytime sleepiness.  Epworth Sleepiness Score: 18  SLEEP STUDY TECHNIQUE As per the AASM Manual for the Scoring of Sleep and Associated Events v2.3 (April 2016) with a hypopnea requiring 4% desaturations.  The channels recorded and monitored were frontal, central and occipital EEG, electrooculogram (EOG), submentalis EMG (chin), nasal and oral airflow, thoracic and abdominal wall motion, anterior tibialis EMG, snore microphone, electrocardiogram, and pulse oximetry. Continuous positive airway pressure (CPAP) was initiated when the patient met split night criteria and was titrated according to treat sleep-disordered breathing.  MEDICATIONS Medications self-administered by patient taken the night of the study : N/A  RESPIRATORY PARAMETERS Diagnostic  Total AHI (/hr): 49.2 RDI (/hr): 49.2 OA Index (/hr): 3.9 CA Index (/hr): 0.0 REM AHI (/hr): N/A NREM AHI (/hr): 49.2 Supine AHI (/hr): 79.5 Non-supine AHI (/hr): 0 Min O2 Sat (%): 77.00 Mean O2 (%): 92.90 Time below 88% (min): 3.1   Titration  Optimal Pressure (cm): 10 AHI at Optimal Pressure (/hr): 0 Min O2 at Optimal Pressure (%): 91.0 Supine % at Optimal (%): 100 Sleep % at Optimal (%): 58   SLEEP ARCHITECTURE The recording time for the entire night was 490.4 minutes.  During a baseline period of 211.3 minutes, the patient slept for 122.0 minutes in REM and nonREM, yielding a sleep efficiency of 57.7%. Sleep onset after lights out was 7.9 minutes with a REM latency of N/A minutes. The patient spent 25.41% of the night in  stage N1 sleep, 74.59% in stage N2 sleep, 0.00% in stage N3 and 0% in REM.  She had trouble with sleep initiation and sleep maintenance due to respiratory events.  This improved during the titration portion of the study.  During the titration period of 275.3 minutes, the patient slept for 167.0 minutes in REM and nonREM, yielding a sleep efficiency of 60.7%. Sleep onset after CPAP initiation was 6.2 minutes with a REM latency of 14.5 minutes. The patient spent 8.38% of the night in stage N1 sleep, 72.46% in stage N2 sleep, 0.00% in stage N3 and 19.2% in REM.  CARDIAC DATA The 2 lead EKG demonstrated sinus rhythm. The mean heart rate was 48.47 beats per minute. Other EKG findings include: None.  LEG MOVEMENT DATA The total Periodic Limb Movements of Sleep (PLMS) were 0. The PLMS index was 0.00 .  IMPRESSIONS - Severe obstructive sleep apnea with an AHI of 42.9 and SpO2 low of 77%. - She did well with CPAP 10 cm H2O. - She did not require supplemental oxygen during this study. - She had trouble with sleep initiation and sleep maintenance due to respiratory events.  This improved once she was on CPAP.  DIAGNOSIS - Obstructive Sleep Apnea (G47.33)  RECOMMENDATIONS - Trial of CPAP therapy on 10 cm H2O with a Medium size Resmed Nasal Cradle AirFit N30 mask and heated humidification. - Re-assess need for sleep aide medication once she is established on CPAP therapy. - Avoid alcohol, sedatives and other CNS depressants that may worsen sleep apnea and disrupt normal sleep architecture. - Sleep hygiene should be reviewed to assess factors that may improve sleep quality. - Weight management and regular exercise should  be initiated or continued.  [Electronically signed] 10/17/2021 04:07 PM  Chesley Mires MD, ABSM Diplomate, American Board of Sleep Medicine NPI: [id number]  [place] PH: [phone]   FX: [phone] [place]

## 2021-10-20 DIAGNOSIS — G4733 Obstructive sleep apnea (adult) (pediatric): Secondary | ICD-10-CM

## 2021-10-22 NOTE — Telephone Encounter (Signed)
Beth, please advise on pt's sleep study results from 7/7. Thanks!

## 2021-10-22 NOTE — Telephone Encounter (Signed)
Sleep study showed that patient does have sleep apnea. Please set him up with video visit to discuss results and treatment plan first available.

## 2021-10-25 ENCOUNTER — Telehealth: Payer: Self-pay | Admitting: Primary Care

## 2021-10-25 NOTE — Telephone Encounter (Signed)
error 

## 2021-10-26 NOTE — Telephone Encounter (Signed)
Sleep study showed severe OSA, he had over 40 events an hour. Needs cpap if he is ok with starting on therapy we can put an order in but he will need a follow-up 31-90 days after starting for compliance check  Order: CPAP at 10cm h20  with Medium size Resmed Nasal Cradle AirFit N30 mask

## 2023-01-30 ENCOUNTER — Ambulatory Visit: Payer: Managed Care, Other (non HMO) | Admitting: Primary Care

## 2023-01-30 ENCOUNTER — Encounter: Payer: Self-pay | Admitting: Primary Care

## 2023-01-30 VITALS — BP 122/80 | HR 55 | Ht 69.0 in | Wt 236.6 lb

## 2023-01-30 DIAGNOSIS — G4733 Obstructive sleep apnea (adult) (pediatric): Secondary | ICD-10-CM | POA: Diagnosis not present

## 2023-01-30 NOTE — Assessment & Plan Note (Signed)
-   Patient has severe obstructive sleep apnea, AHI 42.9 an hour with SpO2 low 77%.  He is 100% compliant with CPAP use greater than 4 hours over the last 2 days.  Sleep remains restless/disruptive.  He falls asleep easily when sitting still or in-active, reports waking up shortly after falling asleep gasping for air. CPAP download shows minimal residual AHI on current pressure settings 10 cm H2O.  Will change CPAP to auto settings 10 to 15 cm H2O.  Advised he get a wedge pillow elevate head while sleeping.  Refer to ENT to assess for upper airway obstruction.  If patient continues to struggle with sleep disruption and daytime sleepiness would recommend getting in lab CPAP/BiPAP titration study.  We could also consider adding medication such as modafinil to help with hypersomnia.

## 2023-01-30 NOTE — Patient Instructions (Addendum)
Please use wedge pillow while sleeping at night with your CPAP  Stay on Ambien for now, may change to a different medication at follow up if still waking up several times a night   Referral: ENT for upper airway exam to assess for obstruction due to sleep apnea   Orders: Adjust CPAP pressure 10-15cm h20  Follow-up: 6-8 weeks with Waynetta Sandy NP

## 2023-01-30 NOTE — Progress Notes (Signed)
@Patient  ID: Carl Kaufman, male    DOB: Sep 08, 1960, 62 y.o.   MRN: [id number]  Chief Complaint  Patient presents with   Consult    Referring provider: Carylon Perches, MD  HPI: 62 year old male, never smoked.  Past medical history significant for hemorrhoids, history of colon polyp, umbilical hernia.  Previous [facility] pulmonary encounter: 06/01/2021 Patient presents today for sleep consult. He has hx sleep apnea, he was previously on CPAP. States that he had a repeat sleep study a [facility] 11 years ago (results are not in chart) and that he only passed because he spent the whole time sleeping on his side. His sleeping symptoms have worsened recently. He has symptoms of snoring, restless sleep and daytime sleepiness.  He has been using his mother in laws machine and states that he can not sleep without it. Current CPAP machine is > 47 years old, he is unsure pressure settings. This machine is not registered to him or managed by a provider. He typically goes to bed at 7:30pm and will often wake up 11-11:30pm and is not able to fall back to sleep. He starts his day at 3:30am. He has a lot of daytime sleepiness despite compliance with cpap.   Sleep questionnaire Symptoms- snoring, witnessed apnea, restless sleep, daytime sleepiness Prior sleep study- Hx OSA, 2011 Bedtime- 7:30pm Time to fall asleep -15 mins  Nocturnal awakenings- 5+ times  Out of bed/started today- 3:30am Weight changes- stable Epworth- 20   01/30/2023 Presents today for sleep follow-up. Hx severe obstructive sleep apnea with an AHI of 42.9 and SpO2 low of 77%.  He is compliant with CPAP use nightly. He takes Ambien before going to bed at 7:30pm. It does not take him long to fall asleep, approximately 5-10 mins. He wakes up 45-60 minutes after initially falling asleep and several more times throughout the night.   He also reports falling asleep easily when in active, he wakes himself up shortly after falling asleep gasping for  air. He feels he has a lot of upper airway congestion/obstruction. He would like to see ENT for evaluation. No significant breathing issues or cough.   Airview download 11/01/2022 - 01/29/2023 Average usage 90/90 days (100%) greater than 4 hours Average usage 7 hours 16 minutes CPAP pressure 10 cm H2O Air leak 7.4 L/min AHI 1.9 (95%)  Allergies  Allergen Reactions   Trazodone Anaphylaxis     There is no immunization history on file for this patient.  Past Medical History:  Diagnosis Date   Arthritis    Family history of adverse reaction to anesthesia    PONV   Heart murmur    Hemorrhoids    Hyperplastic colon polyp 01/02/2015   Hypertension    Joint pain    Sleep apnea    Tubular adenoma 2011    Tobacco History: Social History   Tobacco Use  Smoking Status Never  Smokeless Tobacco Current   Types: Snuff  Tobacco Comments   snuff   Ready to quit: Not Answered Counseling given: Not Answered Tobacco comments: snuff   Outpatient Medications Prior to Visit  Medication Sig Dispense Refill   AMBIEN 5 MG tablet Take 5 mg by mouth at bedtime.     CELEBREX 100 MG capsule Take 100 mg by mouth 2 (two) times daily.     fluticasone (FLONASE) 50 MCG/ACT nasal spray Place 1 spray into both nostrils at bedtime. As needed     hydrochlorothiazide (HYDRODIURIL) 25 MG tablet Take 25 mg by  mouth daily.     lisinopril (ZESTRIL) 40 MG tablet Take 40 mg by mouth daily.     cholecalciferol (VITAMIN D) 25 MCG (1000 UNIT) tablet Take 1,000 Units by mouth daily. (Patient not taking: Reported on 02/13/2021)     meloxicam (MOBIC) 15 MG tablet Take 15 mg by mouth daily.     traZODone (DESYREL) 50 MG tablet Take 1 tablet (50 mg total) by mouth at bedtime as needed for sleep. 30 tablet 2   vitamin C (ASCORBIC ACID) 500 MG tablet Take 500 mg by mouth daily. (Patient not taking: Reported on 02/13/2021)     No facility-administered medications prior to visit.   Review of Systems  Review of  Systems  Constitutional: Negative.   HENT:  Positive for congestion.   Respiratory:  Positive for apnea. Negative for cough, shortness of breath and wheezing.   Psychiatric/Behavioral:  Positive for sleep disturbance.    Physical Exam  BP 122/80 (BP Location: Right Arm, Cuff Size: Large)   Pulse (!) 55   Ht 5\' 9"  (1.753 m)   Wt 236 lb 9.6 oz (107.3 kg)   SpO2 97%   BMI 34.94 kg/m  Physical Exam Constitutional:      Appearance: Normal appearance. He is obese.  HENT:     Mouth/Throat:     Mouth: Mucous membranes are moist.     Pharynx: Oropharynx is clear.     Comments: Mallampati class III Cardiovascular:     Rate and Rhythm: Normal rate and regular rhythm.  Musculoskeletal:        General: Normal range of motion.  Skin:    General: Skin is warm and dry.  Neurological:     General: No focal deficit present.     Mental Status: He is alert and oriented to person, place, and time. Mental status is at baseline.  Psychiatric:        Mood and Affect: Mood normal.        Behavior: Behavior normal.        Thought Content: Thought content normal.        Judgment: Judgment normal.      Lab Results:  CBC    Component Value Date/Time   WBC 8.1 02/02/2018 1509   RBC 5.16 02/02/2018 1509   HGB 16.5 02/02/2018 1509   HCT 46.2 02/02/2018 1509   PLT 256 02/02/2018 1509   MCV 89.5 02/02/2018 1509   MCH 32.0 02/02/2018 1509   MCHC 35.7 02/02/2018 1509   RDW 12.5 02/02/2018 1509   LYMPHSABS 2.0 02/02/2018 1509   MONOABS 0.7 02/02/2018 1509   EOSABS 0.1 02/02/2018 1509   BASOSABS 0.0 02/02/2018 1509    BMET    Component Value Date/Time   NA 137 10/20/2020 1258   K 3.9 10/20/2020 1258   CL 105 10/20/2020 1258   CO2 24 10/20/2020 1258   GLUCOSE 100 (H) 10/20/2020 1258   BUN 17 10/20/2020 1258   CREATININE 1.13 10/20/2020 1258   CALCIUM 9.3 10/20/2020 1258   GFRNONAA >60 10/20/2020 1258   GFRAA >60 02/02/2018 1509    BNP No results found for: "BNP"  ProBNP No  results found for: "PROBNP"  Imaging: No results found.   Assessment & Plan:   Severe obstructive sleep apnea - Patient has severe obstructive sleep apnea, AHI 42.9 an hour with SpO2 low 77%.  He is 100% compliant with CPAP use greater than 4 hours over the last 2 days.  Sleep remains restless/disruptive.  He falls  asleep easily when sitting still or in-active, reports waking up shortly after falling asleep gasping for air. CPAP download shows minimal residual AHI on current pressure settings 10 cm H2O.  Will change CPAP to auto settings 10 to 15 cm H2O.  Advised he get a wedge pillow elevate head while sleeping.  Refer to ENT to assess for upper airway obstruction.  If patient continues to struggle with sleep disruption and daytime sleepiness would recommend getting in lab CPAP/BiPAP titration study.  We could also consider adding medication such as modafinil to help with hypersomnia.   Insomnia - Patient has no difficulty falling asleep, continues to struggle with maintaining sleep  -Consider at follow-up changing Ambien to Lunesta 2 mg at bedtime   Glenford Bayley, NP 01/30/2023

## 2023-01-30 NOTE — Assessment & Plan Note (Signed)
-   Patient has no difficulty falling asleep, continues to struggle with maintaining sleep  -Consider at follow-up changing Ambien to Lunesta 2 mg at bedtime

## 2023-02-07 ENCOUNTER — Encounter (INDEPENDENT_AMBULATORY_CARE_PROVIDER_SITE_OTHER): Payer: Self-pay | Admitting: Otolaryngology

## 2023-02-10 ENCOUNTER — Other Ambulatory Visit: Payer: Self-pay

## 2023-02-10 ENCOUNTER — Encounter (HOSPITAL_BASED_OUTPATIENT_CLINIC_OR_DEPARTMENT_OTHER): Payer: Self-pay | Admitting: Orthopaedic Surgery

## 2023-02-12 ENCOUNTER — Encounter (HOSPITAL_BASED_OUTPATIENT_CLINIC_OR_DEPARTMENT_OTHER)
Admission: RE | Admit: 2023-02-12 | Discharge: 2023-02-12 | Disposition: A | Discharge status: Home / Self Care | Payer: Managed Care, Other (non HMO) | Source: Ambulatory Visit | Attending: Orthopaedic Surgery | Admitting: Orthopaedic Surgery

## 2023-02-12 DIAGNOSIS — I10 Essential (primary) hypertension: Secondary | ICD-10-CM | POA: Insufficient documentation

## 2023-02-12 DIAGNOSIS — Z01812 Encounter for preprocedural laboratory examination: Secondary | ICD-10-CM | POA: Diagnosis present

## 2023-02-12 DIAGNOSIS — Z01818 Encounter for other preprocedural examination: Secondary | ICD-10-CM | POA: Diagnosis not present

## 2023-02-12 DIAGNOSIS — Z01810 Encounter for preprocedural cardiovascular examination: Secondary | ICD-10-CM | POA: Diagnosis present

## 2023-02-12 LAB — BASIC METABOLIC PANEL
Anion gap: 10 (ref 5–15)
BUN: 17 mg/dL (ref 8–23)
CO2: 23 mmol/L (ref 22–32)
Calcium: 9.3 mg/dL (ref 8.9–10.3)
Chloride: 107 mmol/L (ref 98–111)
Creatinine, Ser: 1.17 mg/dL (ref 0.61–1.24)
GFR, Estimated: >60 mL/min (ref >60)
Glucose, Bld: 127 mg/dL — ABNORMAL HIGH (ref 70–99)
Potassium: 3.9 mmol/L (ref 3.5–5.1)
Sodium: 140 mmol/L (ref 135–145)

## 2023-02-12 NOTE — Progress Notes (Deleted)

## 2023-02-17 NOTE — Discharge Instructions (Signed)

## 2023-02-17 NOTE — H&P (Signed)
ORTHOPAEDIC SURGERY H&P  Subjective:  The patient presents with right hallux rigidus.   Past Medical History:  Diagnosis Date   Arthritis    Family history of adverse reaction to anesthesia    PONV   Heart murmur    Hemorrhoids    Hyperplastic colon polyp 01/02/2015   Hypertension    Joint pain    Sleep apnea    Tubular adenoma 2011    Past Surgical History:  Procedure Laterality Date   APPENDECTOMY     20 years ago   COLONOSCOPY  12/13/2009   Dr.Rourk- normal rectum, diminutive cecal polyp, the remaining of the colonic mucosa appeared normal. bx= tubular adenoma   COLONOSCOPY N/A 01/02/2015   Dr.Rourk- grade 3 internal hemorrhoids present. nearly circumferential apparent surgical scar distal rectum just proximal to the anal verge and internal hemorrhoidal plexus. no evidence of obvious neoplam or other abnoramlity. the colonic mucosa appeared normal except for one dimimutive polyp in the base of the cecum. bx= cecal-benign bymphoid polyp, rectum- hyperplastic polyp   COLONOSCOPY WITH PROPOFOL N/A 10/23/2020   : external and internal hemorrhoids. One 5 mm polyp. (Tubular adenoma). Grade 4 hemorrhoids.   foot arthritis Left    scraped    HEMORRHOID BANDING  2016   Dr.Rourk   HERNIA REPAIR Right    inguinal   PLANTAR FASCIA RELEASE Bilateral    POLYPECTOMY  10/23/2020   Procedure: POLYPECTOMY INTESTINAL;  Surgeon: Corbin Ade, MD;  Location: [facility];  Service: Endoscopy;;   STAPLE HEMORRHOIDECTOMY     UMBILICAL HERNIA REPAIR N/A 02/06/2018   Procedure: HERNIA REPAIR UMBILICAL ADULT WITH MESH;  Surgeon: Franky Macho, MD;  Location: [facility];  Service: General;  Laterality: N/A;  umbilical     (Not in an outpatient encounter)    Allergies  Allergen Reactions   Trazodone Anaphylaxis    Social History   Socioeconomic History   Marital status: Married    Spouse name: Not on file   Number of children: Not on file   Years of education: Not on file   Highest  education level: Not on file  Occupational History   Not on file  Tobacco Use   Smoking status: Never   Smokeless tobacco: Current    Types: Snuff   Tobacco comments:    snuff  Vaping Use   Vaping status: Never Used  Substance and Sexual Activity   Alcohol use: Yes    Alcohol/week: 0.0 standard drinks of alcohol    Comment: Only in summer 1-2 drinks 4 days out of the week   Drug use: No   Sexual activity: Yes    Birth control/protection: None  Other Topics Concern   Not on file  Social History Narrative   Not on file   Social Determinants of Health   Financial Resource Strain: Not on file  Food Insecurity: Not on file  Transportation Needs: Not on file  Physical Activity: Not on file  Stress: Not on file  Social Connections: Not on file  Intimate Partner Violence: Not on file     History reviewed. No pertinent family history.   Review of Systems Pertinent items are noted in HPI.  Objective: Vital signs in last 24 hours:    02/10/2023    1:00 PM 01/30/2023    9:33 AM 06/01/2021    9:58 AM  Vitals with BMI  Height 5\' 9"  5\' 9"  5\' 9"   Weight 238 lbs 236 lbs 10 oz 229 lbs  BMI  35.13 34.92 33.8  Systolic  122 120  Diastolic  80 76  Pulse  55 61      EXAM: General: Well nourished, well developed. Awake, alert and oriented to time, place, person. Normal mood and affect. No apparent distress. Breathing room air.  Operative Lower Extremity: Alignment - Neutral Deformity - None Skin intact Tenderness to palpation - right 1st MTP 5/5 TA, PT, GS, Per, EHL, FHL Sensation intact to light touch throughout Palpable DP and PT pulses Special testing: None  The contralateral foot/ankle was examined for comparison and noted to be neurovascularly intact with no localized deformity, swelling, or tenderness.  Imaging Review All images taken were independently reviewed by me.  Assessment/Plan: The clinical and radiographic findings were reviewed and discussed at  length with the patient.  The patient has right hallux rigidus.  We spoke at length about the natural course of these findings. We discussed nonoperative and operative treatment options in detail.  The risks and benefits were presented and reviewed. The risks due to hardware failure/irritation, new/persistent/recurrent infection, stiffness, nerve/vessel/tendon injury, nonunion/malunion of any fracture, wound healing issues, allograft usage, development of arthritis, failure of this surgery, possibility of external fixation in certain situations, possibility of delayed definitive surgery, need for further surgery, prolonged wound care including further soft tissue coverage procedures, thromboembolic events, anesthesia/medical complications/events perioperatively and beyond, amputation, death among others were discussed. The patient acknowledged the explanation and agreed to proceed with the plan.  Carl Kaufman  Orthopaedic Surgery [facility]

## 2023-02-19 ENCOUNTER — Other Ambulatory Visit: Payer: Self-pay

## 2023-02-19 ENCOUNTER — Encounter (HOSPITAL_BASED_OUTPATIENT_CLINIC_OR_DEPARTMENT_OTHER)
Admission: RE | Disposition: A | Discharge status: Home / Self Care | Payer: Self-pay | Source: Home / Self Care | Attending: Orthopaedic Surgery

## 2023-02-19 ENCOUNTER — Encounter (HOSPITAL_BASED_OUTPATIENT_CLINIC_OR_DEPARTMENT_OTHER): Payer: Self-pay | Admitting: Orthopaedic Surgery

## 2023-02-19 ENCOUNTER — Ambulatory Visit (HOSPITAL_BASED_OUTPATIENT_CLINIC_OR_DEPARTMENT_OTHER): Payer: Managed Care, Other (non HMO) | Admitting: Anesthesiology

## 2023-02-19 ENCOUNTER — Ambulatory Visit (HOSPITAL_COMMUNITY): Payer: Managed Care, Other (non HMO)

## 2023-02-19 ENCOUNTER — Ambulatory Visit (HOSPITAL_BASED_OUTPATIENT_CLINIC_OR_DEPARTMENT_OTHER)
Admission: RE | Admit: 2023-02-19 | Discharge: 2023-02-19 | Disposition: A | Discharge status: Home / Self Care | Payer: Managed Care, Other (non HMO) | Attending: Orthopaedic Surgery | Admitting: Orthopaedic Surgery

## 2023-02-19 DIAGNOSIS — Z6834 Body mass index (BMI) 34.0-34.9, adult: Secondary | ICD-10-CM | POA: Diagnosis not present

## 2023-02-19 DIAGNOSIS — I10 Essential (primary) hypertension: Secondary | ICD-10-CM | POA: Insufficient documentation

## 2023-02-19 DIAGNOSIS — M2021 Hallux rigidus, right foot: Secondary | ICD-10-CM | POA: Insufficient documentation

## 2023-02-19 DIAGNOSIS — G4730 Sleep apnea, unspecified: Secondary | ICD-10-CM | POA: Diagnosis not present

## 2023-02-19 DIAGNOSIS — E669 Obesity, unspecified: Secondary | ICD-10-CM | POA: Insufficient documentation

## 2023-02-19 DIAGNOSIS — M1990 Unspecified osteoarthritis, unspecified site: Secondary | ICD-10-CM | POA: Diagnosis not present

## 2023-02-19 HISTORY — PX: ARTHRODESIS METATARSALPHALANGEAL JOINT (MTPJ): SHX6566

## 2023-02-19 SURGERY — FUSION, JOINT, GREAT TOE
Anesthesia: Regional | Site: Toe | Laterality: Right

## 2023-02-19 MED ORDER — ROPIVACAINE HCL 5 MG/ML IJ SOLN
INTRAMUSCULAR | Status: DC | PRN
  Administered 2023-02-19: 40 mL via PERINEURAL

## 2023-02-19 MED ORDER — FENTANYL CITRATE (PF) 100 MCG/2ML IJ SOLN
100.0000 ug | Freq: Once | INTRAMUSCULAR | Status: AC
  Administered 2023-02-19: 100 ug via INTRAVENOUS

## 2023-02-19 MED ORDER — CHLORHEXIDINE GLUCONATE 4 % EX SOLN: 60.0000 mL | Freq: Once | CUTANEOUS | Status: DC

## 2023-02-19 MED ORDER — PROPOFOL 500 MG/50ML IV EMUL
INTRAVENOUS | Status: AC
  Filled 2023-02-19: qty 100

## 2023-02-19 MED ORDER — MIDAZOLAM HCL 2 MG/2ML IJ SOLN
2.0000 mg | Freq: Once | INTRAMUSCULAR | Status: AC
  Administered 2023-02-19: 2 mg via INTRAVENOUS

## 2023-02-19 MED ORDER — SODIUM CHLORIDE (PF) 0.9 % IJ SOLN
INTRAMUSCULAR | Status: AC
  Filled 2023-02-19: qty 10

## 2023-02-19 MED ORDER — PROPOFOL 10 MG/ML IV BOLUS
INTRAVENOUS | Status: DC | PRN
  Administered 2023-02-19 (×2): 50 mg via INTRAVENOUS
  Administered 2023-02-19: 200 mg via INTRAVENOUS

## 2023-02-19 MED ORDER — MIDAZOLAM HCL 2 MG/2ML IJ SOLN
INTRAMUSCULAR | Status: AC
  Filled 2023-02-19: qty 2

## 2023-02-19 MED ORDER — LACTATED RINGERS IV SOLN: INTRAVENOUS | Status: DC

## 2023-02-19 MED ORDER — CEFAZOLIN SODIUM-DEXTROSE 2-4 GM/100ML-% IV SOLN
INTRAVENOUS | Status: AC
  Filled 2023-02-19: qty 100

## 2023-02-19 MED ORDER — CEFAZOLIN SODIUM-DEXTROSE 2-4 GM/100ML-% IV SOLN
2.0000 g | INTRAVENOUS | Status: AC
  Administered 2023-02-19: 2 g via INTRAVENOUS

## 2023-02-19 MED ORDER — FENTANYL CITRATE (PF) 100 MCG/2ML IJ SOLN
INTRAMUSCULAR | Status: AC
  Filled 2023-02-19: qty 2

## 2023-02-19 MED ORDER — ONDANSETRON HCL 4 MG/2ML IJ SOLN
INTRAMUSCULAR | Status: DC | PRN
  Administered 2023-02-19: 4 mg via INTRAVENOUS

## 2023-02-19 MED ORDER — SODIUM CHLORIDE 0.9 % IV SOLN: INTRAVENOUS | Status: DC | PRN

## 2023-02-19 MED ORDER — FENTANYL CITRATE (PF) 250 MCG/5ML IJ SOLN
INTRAMUSCULAR | Status: DC | PRN
  Administered 2023-02-19: 50 ug via INTRAVENOUS

## 2023-02-19 MED ORDER — POVIDONE-IODINE 10 % EX SOLN
CUTANEOUS | Status: DC | PRN
Start: 2023-02-19 — End: 2023-02-19
  Administered 2023-02-19: 1 via TOPICAL

## 2023-02-19 MED ORDER — DEXAMETHASONE SODIUM PHOSPHATE 10 MG/ML IJ SOLN
INTRAMUSCULAR | Status: DC | PRN
  Administered 2023-02-19: 5 mg via INTRAVENOUS

## 2023-02-19 MED ORDER — DEXAMETHASONE SODIUM PHOSPHATE 10 MG/ML IJ SOLN
INTRAMUSCULAR | Status: DC | PRN
  Administered 2023-02-19: 10 mg

## 2023-02-19 MED ORDER — PROPOFOL 10 MG/ML IV BOLUS
INTRAVENOUS | Status: AC
  Filled 2023-02-19: qty 20

## 2023-02-19 MED ORDER — VANCOMYCIN HCL 500 MG IV SOLR
INTRAVENOUS | Status: DC | PRN
  Administered 2023-02-19: 500 mg

## 2023-02-19 MED ORDER — LIDOCAINE 2% (20 MG/ML) 5 ML SYRINGE
INTRAMUSCULAR | Status: DC | PRN
  Administered 2023-02-19: 50 mg via INTRAVENOUS

## 2023-02-19 SURGICAL SUPPLY — 82 items
BANDAGE ESMARK 6X9 LF (GAUZE/BANDAGES/DRESSINGS) ×1 IMPLANT
BIT DRILL 2.5 (BIT) ×1
BIT DRILL 2.6XCANN 4XSCR (BIT) IMPLANT
BIT DRILL 24X2XDISP ORTHOLOC (BIT) IMPLANT
BIT DRILL 60X2.5XDISP (BIT) IMPLANT
BIT DRILL CANN 2.6 (BIT) ×1
BIT DRL 2.6XCANN 4XSCR (BIT) ×1
BIT DRL 24X2XDISP ORTHOLOC (BIT) ×1
BIT DRL 60X2.5XDISP (BIT) ×1
BLADE AVERAGE 25X9 (BLADE) IMPLANT
BLADE SURG 15 STRL LF DISP TIS (BLADE) ×2 IMPLANT
BLADE SURG 15 STRL SS (BLADE) ×2
BNDG COHESIVE 4X5 TAN STRL LF (GAUZE/BANDAGES/DRESSINGS) ×1 IMPLANT
BNDG ELASTIC 4INX 5YD STR LF (GAUZE/BANDAGES/DRESSINGS) ×1 IMPLANT
BNDG ELASTIC 6INX 5YD STR LF (GAUZE/BANDAGES/DRESSINGS) ×1 IMPLANT
BNDG ELASTIC 6X10 VLCR STRL LF (GAUZE/BANDAGES/DRESSINGS) IMPLANT
BNDG ESMARK 6X9 LF (GAUZE/BANDAGES/DRESSINGS) ×1
BNDG GAUZE DERMACEA FLUFF 4 (GAUZE/BANDAGES/DRESSINGS) ×1 IMPLANT
CANISTER SUCT 1200ML W/VALVE (MISCELLANEOUS) ×1 IMPLANT
CHLORAPREP W/TINT 26 (MISCELLANEOUS) ×1 IMPLANT
COVER BACK TABLE 60X90IN (DRAPES) ×1 IMPLANT
CUFF TOURN SGL QUICK 34 (TOURNIQUET CUFF) ×1
CUFF TRNQT CYL 34X4.125X (TOURNIQUET CUFF) ×1 IMPLANT
DRAPE C-ARM 42X72 X-RAY (DRAPES) ×1 IMPLANT
DRAPE C-ARMOR (DRAPES) ×1 IMPLANT
DRAPE EXTREMITY T 121X128X90 (DISPOSABLE) ×1 IMPLANT
DRAPE IMP U-DRAPE 54X76 (DRAPES) ×1 IMPLANT
DRAPE U-SHAPE 47X51 STRL (DRAPES) ×1 IMPLANT
DRILL BIT 2.0 (BIT) ×1
DRSG COTTON ROLL NSTRL (GAUZE/BANDAGES/DRESSINGS) ×1 IMPLANT
DRSG MEPITEL 4X7.2 (GAUZE/BANDAGES/DRESSINGS) ×1 IMPLANT
ELECT REM PT RETURN 9FT ADLT (ELECTROSURGICAL) ×1
ELECTRODE REM PT RTRN 9FT ADLT (ELECTROSURGICAL) ×1 IMPLANT
GAUZE PAD ABD 8X10 STRL (GAUZE/BANDAGES/DRESSINGS) IMPLANT
GAUZE SPONGE 4X4 12PLY STRL (GAUZE/BANDAGES/DRESSINGS) ×1 IMPLANT
GAUZE XEROFORM 1X8 LF (GAUZE/BANDAGES/DRESSINGS) ×1 IMPLANT
GLOVE BIOGEL PI IND STRL 7.0 (GLOVE) IMPLANT
GLOVE BIOGEL PI IND STRL 8 (GLOVE) ×1 IMPLANT
GLOVE SURG SS PI 7.0 STRL IVOR (GLOVE) IMPLANT
GLOVE SURG SS PI 7.5 STRL IVOR (GLOVE) ×1 IMPLANT
GOWN STRL REUS W/ TWL LRG LVL3 (GOWN DISPOSABLE) ×2 IMPLANT
GOWN STRL REUS W/TWL LRG LVL3 (GOWN DISPOSABLE) ×2
K-WIRE NONTHRD 1.2 (WIRE) ×3
KWIRE NONTHRD 1.2 (WIRE) IMPLANT
NDL HYPO 22X1.5 SAFETY MO (MISCELLANEOUS) IMPLANT
NEEDLE HYPO 22X1.5 SAFETY MO (MISCELLANEOUS) IMPLANT
NS IRRIG 1000ML POUR BTL (IV SOLUTION) ×1 IMPLANT
PACK BASIN DAY SURGERY FS (CUSTOM PROCEDURE TRAY) ×1 IMPLANT
PAD CAST 4YDX4 CTTN HI CHSV (CAST SUPPLIES) ×1 IMPLANT
PADDING CAST ABS COTTON 4X4 ST (CAST SUPPLIES) IMPLANT
PADDING CAST COTTON 4X4 STRL (CAST SUPPLIES) ×1
PADDING CAST COTTON 6X4 STRL (CAST SUPPLIES) IMPLANT
PENCIL SMOKE EVACUATOR (MISCELLANEOUS) ×1 IMPLANT
PIN TEMP (PIN) IMPLANT
PLATE MTP 0 DEG RT (Plate) IMPLANT
SCREW HEADLESS 4.0X38 (Screw) IMPLANT
SCREW LOCK 3.5X16 (Screw) ×1 IMPLANT
SCREW LOCK FT 14X2.7XSTPA (Screw) IMPLANT
SCREW LOCK FT 16X3.5XST PA (Screw) IMPLANT
SCREW LOCKING 2.7X14 (Screw) ×2 IMPLANT
SCREW NLCK FT 20X3.5XST LOPRFL (Screw) IMPLANT
SCREW NONLOCK 3.5X20 (Screw) ×1 IMPLANT
SHEET MEDIUM DRAPE 40X70 STRL (DRAPES) ×1 IMPLANT
SLEEVE SCD COMPRESS KNEE MED (STOCKING) ×1 IMPLANT
SPIKE FLUID TRANSFER (MISCELLANEOUS) IMPLANT
SPLINT FIBERGLASS 4X30 (CAST SUPPLIES) IMPLANT
SPLINT PLASTER CAST FAST 5X30 (CAST SUPPLIES) ×20 IMPLANT
SPONGE T-LAP 18X18 ~~LOC~~+RFID (SPONGE) ×1 IMPLANT
STOCKINETTE 6 STRL (DRAPES) ×1 IMPLANT
SUCTION TUBE FRAZIER 10FR DISP (SUCTIONS) ×1 IMPLANT
SUT ETHILON 2 0 FS 18 (SUTURE) ×1 IMPLANT
SUT FIBERWIRE #2 38 T-5 BLUE (SUTURE)
SUT MNCRL AB 3-0 PS2 18 (SUTURE) IMPLANT
SUT VIC AB 2-0 SH 27 (SUTURE) ×2
SUT VIC AB 2-0 SH 27XBRD (SUTURE) ×1 IMPLANT
SUT VICRYL 0 SH 27 (SUTURE) IMPLANT
SUTURE FIBERWR #2 38 T-5 BLUE (SUTURE) IMPLANT
SYR BULB EAR ULCER 3OZ GRN STR (SYRINGE) ×1 IMPLANT
SYR CONTROL 10ML LL (SYRINGE) IMPLANT
TOWEL GREEN STERILE FF (TOWEL DISPOSABLE) ×2 IMPLANT
TUBE CONNECTING 20X1/4 (TUBING) ×1 IMPLANT
UNDERPAD 30X36 HEAVY ABSORB (UNDERPADS AND DIAPERS) ×1 IMPLANT

## 2023-02-19 NOTE — Progress Notes (Signed)
Assisted Dr. Salvadore Farber with right, popliteal/saphenous, ultrasound guided block. Side rails up, monitors on throughout procedure. See vital signs in flow sheet. Tolerated Procedure well.

## 2023-02-19 NOTE — Anesthesia Postprocedure Evaluation (Signed)
Anesthesia Post Note  Patient: RANJIT GANEY  Procedure(s) Performed: ARTHRODESIS METATARSALPHALANGEAL JOINT (MTPJ) RIGHT HALLUXj (Right: Toe)     Patient location during evaluation: PACU Anesthesia Type: Regional and General Level of consciousness: awake and alert, oriented and patient cooperative Pain management: pain level controlled Vital Signs Assessment: post-procedure vital signs reviewed and stable Respiratory status: spontaneous breathing, nonlabored ventilation and respiratory function stable Cardiovascular status: blood pressure returned to baseline and stable Postop Assessment: no apparent nausea or vomiting Anesthetic complications: no   No notable events documented.  Last Vitals:  Vitals:   02/19/23 1404 02/19/23 1415  BP:  (!) 147/88  Pulse: 65 70  Resp: 17 16  Temp:    SpO2: 93% 93%    Last Pain:  Vitals:   02/19/23 1403  TempSrc:   PainSc: Asleep                 Lannie Fields

## 2023-02-19 NOTE — Anesthesia Preprocedure Evaluation (Addendum)
Anesthesia Evaluation  Patient identified by MRN, date of birth, ID band Patient awake    Reviewed: Allergy & Precautions, NPO status , Patient's Chart, lab work & pertinent test results  Airway Mallampati: IV  TM Distance: >3 FB Neck ROM: Full    Dental  (+) Teeth Intact, Dental Advisory Given   Pulmonary sleep apnea and Continuous Positive Airway Pressure Ventilation    Pulmonary exam normal breath sounds clear to auscultation       Cardiovascular hypertension (152/88 preop), Pt. on medications Normal cardiovascular exam Rhythm:Regular Rate:Normal     Neuro/Psych negative neurological ROS  negative psych ROS   GI/Hepatic negative GI ROS, Neg liver ROS,,,  Endo/Other  Obesity BMI 34  Renal/GU negative Renal ROS  negative genitourinary   Musculoskeletal  (+) Arthritis , Osteoarthritis,    Abdominal  (+) + obese  Peds  Hematology negative hematology ROS (+)   Anesthesia Other Findings Just finished steroid pack for joint pain  Reproductive/Obstetrics negative OB ROS                             Anesthesia Physical Anesthesia Plan  ASA: 2  Anesthesia Plan: General and Regional   Post-op Pain Management: Regional block* and Tylenol PO (pre-op)*   Induction: Intravenous  PONV Risk Score and Plan: 2 and Ondansetron, Dexamethasone, Midazolam and Treatment may vary due to age or medical condition  Airway Management Planned: LMA  Additional Equipment: None  Intra-op Plan:   Post-operative Plan: Extubation in OR  Informed Consent: I have reviewed the patients History and Physical, chart, labs and discussed the procedure including the risks, benefits and alternatives for the proposed anesthesia with the patient or authorized representative who has indicated his/her understanding and acceptance.     Dental advisory given  Plan Discussed with: CRNA  Anesthesia Plan Comments:         Anesthesia Quick Evaluation

## 2023-02-19 NOTE — Anesthesia Procedure Notes (Signed)
Anesthesia Regional Block: Adductor canal block   Pre-Anesthetic Checklist: , timeout performed,  Correct Patient, Correct Site, Correct Laterality,  Correct Procedure, Correct Position, site marked,  Risks and benefits discussed,  Surgical consent,  Pre-op evaluation,  At surgeon's request and post-op pain management  Laterality: Right  Prep: Maximum Sterile Barrier Precautions used, chloraprep       Needles:  Injection technique: Single-shot  Needle Type: Echogenic Stimulator Needle     Needle Length: 9cm  Needle Gauge: 22     Additional Needles:   Procedures:,,,, ultrasound used (permanent image in chart),,    Narrative:  Start time: 02/19/2023 10:55 AM End time: 02/19/2023 11:00 AM Injection made incrementally with aspirations every 5 mL.  Performed by: Personally  Anesthesiologist: Lannie Fields, DO  Additional Notes: Monitors applied. No increased pain on injection. No increased resistance to injection. Injection made in 5cc increments. Good needle visualization. Patient tolerated procedure well.

## 2023-02-19 NOTE — Anesthesia Procedure Notes (Signed)
Anesthesia Regional Block: Popliteal block   Pre-Anesthetic Checklist: , timeout performed,  Correct Patient, Correct Site, Correct Laterality,  Correct Procedure, Correct Position, site marked,  Risks and benefits discussed,  Surgical consent,  Pre-op evaluation,  At surgeon's request and post-op pain management  Laterality: Right  Prep: Maximum Sterile Barrier Precautions used, chloraprep       Needles:  Injection technique: Single-shot  Needle Type: Echogenic Stimulator Needle     Needle Length: 9cm  Needle Gauge: 22     Additional Needles:   Procedures:,,,, ultrasound used (permanent image in chart),,    Narrative:  Start time: 02/19/2023 10:50 AM End time: 02/19/2023 10:55 AM Injection made incrementally with aspirations every 5 mL.  Performed by: Personally  Anesthesiologist: Lannie Fields, DO  Additional Notes: Monitors applied. No increased pain on injection. No increased resistance to injection. Injection made in 5cc increments. Good needle visualization. Patient tolerated procedure well.

## 2023-02-19 NOTE — Anesthesia Procedure Notes (Signed)
Procedure Name: LMA Insertion Date/Time: 02/19/2023 12:23 PM  Performed by: Demetrio Lapping, CRNAPre-anesthesia Checklist: Patient identified, Emergency Drugs available, Suction available and Patient being monitored Patient Re-evaluated:Patient Re-evaluated prior to induction Oxygen Delivery Method: Circle System Utilized Preoxygenation: Pre-oxygenation with 100% oxygen Induction Type: IV induction Ventilation: Mask ventilation without difficulty LMA: LMA inserted LMA Size: 4.0 Number of attempts: 2 Airway Equipment and Method: Bite block Placement Confirmation: positive ETCO2 Tube secured with: Tape Dental Injury: Teeth and Oropharynx as per pre-operative assessment

## 2023-02-19 NOTE — Transfer of Care (Signed)
Immediate Anesthesia Transfer of Care Note  Patient: Carl Kaufman  Procedure(s) Performed: ARTHRODESIS METATARSALPHALANGEAL JOINT (MTPJ) RIGHT HALLUXj (Right: Toe)  Patient Location: PACU  Anesthesia Type:General  Level of Consciousness: drowsy  Airway & Oxygen Therapy: Patient Spontanous Breathing and Patient connected to face mask oxygen  Post-op Assessment: Report given to RN and Post -op Vital signs reviewed and stable  Post vital signs: Reviewed  Last Vitals:  Vitals Value Taken Time  BP 131/75 02/19/23 1403  Temp 36.3 C 02/19/23 1400  Pulse 70 02/19/23 1411  Resp 20 02/19/23 1411  SpO2 93 % 02/19/23 1411  Vitals shown include unfiled device data.  Last Pain:  Vitals:   02/19/23 1017  TempSrc: Temporal  PainSc: 3          Complications: No notable events documented.

## 2023-02-19 NOTE — H&P (Signed)

## 2023-02-20 ENCOUNTER — Encounter (HOSPITAL_BASED_OUTPATIENT_CLINIC_OR_DEPARTMENT_OTHER): Payer: Self-pay | Admitting: Orthopaedic Surgery

## 2023-02-22 NOTE — Op Note (Signed)
02/19/2023  1:56 PM   PATIENT: Carl Kaufman  62 y.o. male  MRN: [id number]   PRE-OPERATIVE DIAGNOSIS:   Acquired right hallux rigidus   POST-OPERATIVE DIAGNOSIS:   Acquired right hallux rigidus   PROCEDURE: Right 1st MTP arthrodesis   SURGEON:  Netta Cedars, MD   ASSISTANT: None   ANESTHESIA: General, regional   EBL: Minimal   TOURNIQUET:    Total Tourniquet Time Documented: Thigh (Right) - 68 minutes Total: Thigh (Right) - 68 minutes    COMPLICATIONS: None apparent   DISPOSITION: Extubated, awake and stable to recovery.   INDICATION FOR PROCEDURE: The patient presented with above diagnosis.  We discussed the diagnosis, alternative treatment options, risks and benefits of the above surgical intervention, as well as alternative non-operative treatments. All questions/concerns were addressed and the patient/family demonstrated appropriate understanding of the diagnosis, the procedure, the postoperative course, and overall prognosis. The patient wished to proceed with surgical intervention and signed an informed surgical consent as such, in each others presence prior to surgery.   PROCEDURE IN DETAIL: After preoperative consent was obtained and the correct operative site was identified, the patient was brought to the operating room supine on stretcher and transferred onto operating table. General anesthesia was induced. Preoperative antibiotics were administered. Surgical timeout was taken. The patient was then positioned supine with an ipsilateral hip bump. The operative lower extremity was prepped and draped in standard sterile fashion with a tourniquet around the thigh. The extremity was exsanguinated and the tourniquet was inflated to 275 mmHg.  A dorsal approach was made directly over the first metatarsophalangeal joint. This was carried down to the level of the extensor hallucis longus tendon. The capsule was incised medial to the tendon, and the  tendon was protected throughout the procedure. The capsule and surrounding ligaments were released in order to allow full exposure of the first MTP joint. The cartilage remaining on either articular surface of the joint was debrided using curette and rongeur. A guide wire was placed directly into the first metatarsal and appropriate sized reamer was used to prepare the surface. This guidewire was then used in the proximal phalanx, and that articular surface was reamed similarly. Both joint surfaces were perforated with a K wire in order to stimulate bone healing across the arthrodesis site. The medial eminence was resected with a saw blade.    A crossing guide wire was placed to secure the arthrodesis in appropriate position. This was verified clinically using foot plate as well as with fluoroscopy. A variable pitch Dartfire compression screw was implanted over this guidewire using cannulated drill technique achieving excellent compression across the arthrodesis site. We then selected an appropriate sized Ortholoc plate and implanted this with a series of locking screws. Footplate and fluoroscopy were used throughout to verify appropriate position of the fusion.   The surgical sites were thoroughly irrigated. The tourniquet was deflated and hemostasis achieved. Betadine and vancomycin powder were applied. The deep layers were closed using 2-0 vicryl. The skin was closed without tension.    The leg was cleaned with saline and sterile dressings with gauze were applied. A well padded bulky short leg splint was applied. The patient was awakened from anesthesia and transported to the recovery room in stable condition.    FOLLOW UP PLAN: -transfer to PACU, then home -strict NWB operative extremity, maximum elevation -maintain short leg splint until follow up -DVT ppx: Aspirin 81 mg twice daily while NWB -follow up as outpatient within 7-10 days for wound  check with exchange of short leg splint to short leg  cast -sutures out in 2-3 weeks in outpatient office   RADIOGRAPHS: AP, lateral, oblique and stress radiographs of the right foot were obtained intraoperatively. These showed interval reduction and fixation of the fractures. Manual stress radiographs were taken and the joints were noted to be stable following fixation. All hardware is appropriately positioned and of the appropriate lengths. No other acute injuries are noted.   Netta Cedars Orthopaedic Surgery [facility]

## 2023-03-05 ENCOUNTER — Institutional Professional Consult (permissible substitution) (INDEPENDENT_AMBULATORY_CARE_PROVIDER_SITE_OTHER): Payer: Managed Care, Other (non HMO) | Admitting: Otolaryngology

## 2023-03-12 ENCOUNTER — Encounter (INDEPENDENT_AMBULATORY_CARE_PROVIDER_SITE_OTHER): Payer: Self-pay | Admitting: Otolaryngology

## 2023-03-12 ENCOUNTER — Ambulatory Visit (INDEPENDENT_AMBULATORY_CARE_PROVIDER_SITE_OTHER): Payer: Managed Care, Other (non HMO) | Admitting: Otolaryngology

## 2023-03-12 VITALS — BP 173/85 | HR 63 | Ht 69.0 in | Wt 228.0 lb

## 2023-03-12 DIAGNOSIS — F419 Anxiety disorder, unspecified: Secondary | ICD-10-CM

## 2023-03-12 DIAGNOSIS — J3089 Other allergic rhinitis: Secondary | ICD-10-CM

## 2023-03-12 DIAGNOSIS — J342 Deviated nasal septum: Secondary | ICD-10-CM

## 2023-03-12 DIAGNOSIS — R0981 Nasal congestion: Secondary | ICD-10-CM

## 2023-03-12 DIAGNOSIS — G4733 Obstructive sleep apnea (adult) (pediatric): Secondary | ICD-10-CM

## 2023-03-12 DIAGNOSIS — Z789 Other specified health status: Secondary | ICD-10-CM

## 2023-03-12 DIAGNOSIS — J329 Chronic sinusitis, unspecified: Secondary | ICD-10-CM

## 2023-03-12 DIAGNOSIS — J343 Hypertrophy of nasal turbinates: Secondary | ICD-10-CM

## 2023-03-12 DIAGNOSIS — K219 Gastro-esophageal reflux disease without esophagitis: Secondary | ICD-10-CM | POA: Diagnosis not present

## 2023-03-12 NOTE — Progress Notes (Unsigned)
ENT CONSULT:  Reason for Consult: OSA and CPAP intolerance, apneas when falling asleep    HPI: Discussed the use of AI scribe software for clinical note transcription with the patient, who gave verbal consent to proceed.  History of Present Illness   The patient is a 62 yoM, with a known diagnosis of sleep apnea, presents with concerns about their current CPAP therapy and apneas when falling asleep that wake him up. They report consistent use of the CPAP device every night, but have been experiencing difficulty tolerating it. The patient describes a sensation of breathing in 'hot steam' upon initial use of the device, despite having the heating tube and humidifier turned off. They have attempted to alleviate this discomfort by applying vapor rub in their nose prior to use.  Despite taking Ambien, the patient reports fragmented sleep, waking up frequently throughout the night. They also express concern about episodes of apnea occurring during periods of relaxation when he tries to nap during the day, which they describe as stopping breathing once they become inactive or start to fall asleep.  The patient has a long-standing history of severe sleep apnea, which they report has been worsening over time. They have tried various CPAP masks, including full face and nasal pillows, but continue to struggle with the sensation of breathing in their own exhaled air.  The patient also reports a history of nasal congestion, which they have attempted to manage with Flonase and Afrin in the past, with limited success. They have not been formally diagnosed with insomnia. They report frequent daytime sleepiness and difficulty staying awake during periods of inactivity.  The patient has recently gained weight and underwent foot surgery for arthritis two weeks prior to the consultation. They deny any history of lung disease, heart disease, stroke, or heart attack, and are not on blood thinners. Their current  medications include blood pressure medication and Ambien. He takes Ambien in hopes of improving quality of sleep. He denies difficulty falling asleep. ESS 20 when seen by Pulmonary. He had evidence of severe obstructive sleep apnea with an AHI of 42.9 and SpO2 low of 77%, no central apneas noted on study 10/22/21 (split night sleep study).    Records Reviewed:  62 year old male, never smoked.  Past medical history significant for hemorrhoids, history of colon polyp, umbilical hernia.   Previous [facility] pulmonary encounter: 06/01/2021 Patient presents today for sleep consult. He has hx sleep apnea, he was previously on CPAP. States that he had a repeat sleep study a [facility] 11 years ago (results are not in chart) and that he only passed because he spent the whole time sleeping on his side. His sleeping symptoms have worsened recently. He has symptoms of snoring, restless sleep and daytime sleepiness.  He has been using his mother in laws machine and states that he can not sleep without it. Current CPAP machine is > 83 years old, he is unsure pressure settings. This machine is not registered to him or managed by a provider. He typically goes to bed at 7:30pm and will often wake up 11-11:30pm and is not able to fall back to sleep. He starts his day at 3:30am. He has a lot of daytime sleepiness despite compliance with cpap.    Sleep questionnaire Symptoms- snoring, witnessed apnea, restless sleep, daytime sleepiness Prior sleep study- Hx OSA, 2011 Bedtime- 7:30pm Time to fall asleep -15 mins  Nocturnal awakenings- 5+ times  Out of bed/started today- 3:30am Weight changes- stable Epworth- 20  01/30/2023 Presents today for sleep follow-up. Hx severe obstructive sleep apnea with an AHI of 42.9 and SpO2 low of 77%.  He is compliant with CPAP use nightly. He takes Ambien before going to bed at 7:30pm. It does not take him long to fall asleep, approximately 5-10 mins. He wakes up 45-60 minutes after  initially falling asleep and several more times throughout the night.    He also reports falling asleep easily when in active, he wakes himself up shortly after falling asleep gasping for air. He feels he has a lot of upper airway congestion/obstruction. He would like to see ENT for evaluation. No significant breathing issues or cough.    Airview download 11/01/2022 - 01/29/2023 Average usage 90/90 days (100%) greater than 4 hours Average usage 7 hours 16 minutes CPAP pressure 10 cm H2O Air leak 7.4 L/min AHI 1.9 (95%)  Severe obstructive sleep apnea - Patient has severe obstructive sleep apnea, AHI 42.9 an hour with SpO2 low 77%.  He is 100% compliant with CPAP use greater than 4 hours over the last 2 days.  Sleep remains restless/disruptive.  He falls asleep easily when sitting still or in-active, reports waking up shortly after falling asleep gasping for air. CPAP download shows minimal residual AHI on current pressure settings 10 cm H2O.  Will change CPAP to auto settings 10 to 15 cm H2O.  Advised he get a wedge pillow elevate head while sleeping.  Refer to ENT to assess for upper airway obstruction.  If patient continues to struggle with sleep disruption and daytime sleepiness would recommend getting in lab CPAP/BiPAP titration study.  We could also consider adding medication such as modafinil to help with hypersomnia.    Insomnia - Patient has no difficulty falling asleep, continues to struggle with maintaining sleep  -Consider at follow-up changing Ambien to Lunesta 2 mg at bedtime   Past Medical History:  Diagnosis Date   Arthritis    Family history of adverse reaction to anesthesia    PONV   Heart murmur    Hemorrhoids    Hyperplastic colon polyp 01/02/2015   Hypertension    Joint pain    Sleep apnea    Tubular adenoma 2011    Past Surgical History:  Procedure Laterality Date   APPENDECTOMY     20 years ago   ARTHRODESIS METATARSALPHALANGEAL JOINT (MTPJ) Right 02/19/2023    Procedure: ARTHRODESIS METATARSALPHALANGEAL JOINT (MTPJ) RIGHT HALLUXj;  Surgeon: Netta Cedars, MD;  Location: [facility];  Service: Orthopedics;  Laterality: Right;   COLONOSCOPY  12/13/2009   Dr.Rourk- normal rectum, diminutive cecal polyp, the remaining of the colonic mucosa appeared normal. bx= tubular adenoma   COLONOSCOPY N/A 01/02/2015   Dr.Rourk- grade 3 internal hemorrhoids present. nearly circumferential apparent surgical scar distal rectum just proximal to the anal verge and internal hemorrhoidal plexus. no evidence of obvious neoplam or other abnoramlity. the colonic mucosa appeared normal except for one dimimutive polyp in the base of the cecum. bx= cecal-benign bymphoid polyp, rectum- hyperplastic polyp   COLONOSCOPY WITH PROPOFOL N/A 10/23/2020   : external and internal hemorrhoids. One 5 mm polyp. (Tubular adenoma). Grade 4 hemorrhoids.   foot arthritis Left    scraped    HEMORRHOID BANDING  2016   Dr.Rourk   HERNIA REPAIR Right    inguinal   PLANTAR FASCIA RELEASE Bilateral    POLYPECTOMY  10/23/2020   Procedure: POLYPECTOMY INTESTINAL;  Surgeon: Corbin Ade, MD;  Location: [facility];  Service: Endoscopy;;  STAPLE HEMORRHOIDECTOMY     UMBILICAL HERNIA REPAIR N/A 02/06/2018   Procedure: HERNIA REPAIR UMBILICAL ADULT WITH MESH;  Surgeon: Franky Macho, MD;  Location: [facility];  Service: General;  Laterality: N/A;  umbilical    Family History  Problem Relation Age of Onset   Colon cancer Neg Hx     Social History:  reports that he has never smoked. His smokeless tobacco use includes snuff. He reports current alcohol use. He reports that he does not use drugs.  Allergies:  Allergies  Allergen Reactions   Trazodone Anaphylaxis    Medications: I have reviewed the patient's current medications.  The PMH, PSH, Medications, Allergies, and SH were reviewed and updated.  ROS: Constitutional: Negative for fever, weight loss and weight  gain. Cardiovascular: Negative for chest pain and dyspnea on exertion. Respiratory: Is not experiencing shortness of breath at rest. Gastrointestinal: Negative for nausea and vomiting. Neurological: Negative for headaches. Psychiatric: The patient is not nervous/anxious  Blood pressure (!) 173/85, pulse 63, height 5\' 9"  (1.753 m), weight 228 lb (103.4 kg), SpO2 97%.  PHYSICAL EXAM:  Exam: General: Well-developed, well-nourished Respiratory Respiratory effort: Equal inspiration and expiration without stridor Cardiovascular Peripheral Vascular: Warm extremities with equal color/perfusion Eyes: No nystagmus with equal extraocular motion bilaterally Neuro/Psych/Balance: Patient oriented to person, place, and time; Appropriate mood and affect; Gait is intact with no imbalance; Cranial nerves I-XII are intact Head and Face Inspection: Normocephalic and atraumatic without mass or lesion Palpation: Facial skeleton intact without bony stepoffs Salivary Glands: No mass or tenderness Facial Strength: Facial motility symmetric and full bilaterally ENT Pinna: External ear intact and fully developed External canal: Canal is patent with intact skin Tympanic Membrane: Clear and mobile External Nose: No scar or anatomic deformity Internal Nose: Septum is deviated with S-shaped septum. No polyp, or purulence. Mucosal edema and erythema present.  Bilateral inferior turbinate hypertrophy.  Lips, Teeth, and gums: Mucosa and teeth intact and viable TMJ: No pain to palpation with full mobility Oral cavity/oropharynx: No erythema or exudate, no lesions present. Friedman IV tongue position and absent tonsils prominent base of the tongue Nasopharynx: No mass or lesion with intact mucosa Hypopharynx: Intact mucosa without pooling of secretions Larynx Glottic: Full true vocal cord mobility without lesion or mass Supraglottic: Normal appearing epiglottis and AE folds Interarytenoid Space: Moderate  pachydermia/edema Subglottic Space: Patent without lesion or edema Neck Neck and Trachea: Midline trachea without mass or lesion Thyroid: No mass or nodularity Lymphatics: No lymphadenopathy  Procedure:  Preoperative diagnosis: OSA, CPAP intolerance  Postoperative diagnosis:   Same + GERD LPR  Procedure: Flexible fiberoptic laryngoscopy  Surgeon: Ashok Croon, MD  Anesthesia: Topical lidocaine and Afrin Complications: None Condition is stable throughout exam  Indications and consent:  The patient presents to the clinic.  Indirect laryngoscopy view was incomplete. Thus it was recommended that they undergo a flexible fiberoptic laryngoscopy. All of the risks, benefits, and potential complications were reviewed with the patient preoperatively and verbal informed consent was obtained.  Procedure: The patient was seated upright in the clinic. Topical lidocaine and Afrin were applied to the nasal cavity. After adequate anesthesia had occurred, I then proceeded to pass the flexible telescope into the nasal cavity. The nasal cavity was patent without rhinorrhea or polyp. The nasopharynx was also patent without mass or lesion. The base of tongue was visualized and was normal. There were no signs of pooling of secretions in the piriform sinuses. The true vocal folds were mobile bilaterally. There were no  signs of glottic or supraglottic mucosal lesion or mass. There was moderate interarytenoid pachydermia and post cricoid edema. The telescope was then slowly withdrawn and the patient tolerated the procedure throughout.    PROCEDURE NOTE: nasal endoscopy  Preoperative diagnosis: chronic sinusitis symptoms  Postoperative diagnosis: same  Procedure: Diagnostic nasal endoscopy (51884)  Surgeon: Ashok Croon, M.D.  Anesthesia: Topical lidocaine and Afrin  H&P REVIEW: The patient's history and physical were reviewed today prior to procedure. All medications were reviewed and updated as  well. Complications: None Condition is stable throughout exam Indications and consent: The patient presents with symptoms of chronic sinusitis not responding to previous therapies. All the risks, benefits, and potential complications were reviewed with the patient preoperatively and informed consent was obtained. The time out was completed with confirmation of the correct procedure.   Procedure: The patient was seated upright in the clinic. Topical lidocaine and Afrin were applied to the nasal cavity. After adequate anesthesia had occurred, the rigid nasal endoscope was passed into the nasal cavity. The nasal mucosa, turbinates, septum, and sinus drainage pathways were visualized bilaterally. This revealed no purulence or significant secretions that might be cultured. There were no polyps or sites of significant inflammation. The mucosa was intact and there was no crusting present. The scope was then slowly withdrawn and the patient tolerated the procedure well. There were no complications or blood loss.   Studies Reviewed: Sleep Study 10/22/21 - split night sleep study AHI of 49.2 no central/mixed apneas  BMI of 34 at the time of the study    Assessment/Plan: Encounter Diagnoses  Name Primary?   OSA (obstructive sleep apnea) Yes   Intolerance of continuous positive airway pressure (CPAP) ventilation    Chronic GERD    Chronic nasal congestion    Environmental and seasonal allergies    Nasal septal deviation    Hypertrophy of both inferior nasal turbinates     Assessment and Plan    Obstructive Sleep Apnea (OSA) Long-standing OSA for over 15 years, currently using CPAP with poor tolerance. Reports discomfort with the mask, chronic nasal congestion and poor quality of sleep. Tried various masks/settings without improvement. Exam with Friedman IV tongue position absent tonsils, and S-shaped septum mild inferior turbinate hypertrophy. No pathology on flexile laryngoscopy today as from  changes c/w GERD LPR. Discussed potential surgical options including Inspire implant. Severe OSA qualifies for Inspire, pending further evaluation with repeat sleep study and potential DISE. Explained Inspire implant procedure, risks, and alternatives including continued CPAP and septum surgery. - Repeat sleep study with Pulm as scheduled - Provided Inspire implant brochure/information - Advised researching Inspire implant - Consider DISE if  he elects to pursue Inspire implant - Follow up repeat sleep study  Difficulty staying asleep, denies trouble falling asleep - could be related to CPAP intolerance  Difficulty maintaining sleep, currently on Ambien. Sleep physician suggested Lunesta. Symptoms suggest sleep maintenance insomnia. - Discuss switching to [facility] with sleep physician - Follow up with sleep physician  Chronic nasal congestion  Continue Flonase 2 puffs b/l nares BID and add Ocean Spray vaseline to prevent nasal dryness - Rx sent We discussed importance of avoiding habitual Afrin use Zyrtec at night 10 mg daily - Rx sent  Anxiety Significant anxiety, especially related to sleep and breathing. Acknowledged as a significant concern. - Recommend discussing anxiety with primary care physician or mental health professional  Follow-up - Follow up with sleep physician in a couple of weeks - Schedule follow-up with otolaryngologist if pursuing  Inspire implant.      I spent 45 minutes in total face-to-face time and in reviewing records during pre-charting, more than 50% of which was spent in counseling and coordination of care, reviewing test results, reviewing medications and treatment regimen and/or in discussing or reviewing the diagnosis, the prognosis and treatment options. Pertinent laboratory and imaging test results that were available during this visit with the patient were reviewed by me and considered in my medical decision making (see chart for details).     Thank you  for allowing me to participate in the care of this patient. Please do not hesitate to contact me with any questions or concerns.   Ashok Croon, MD Otolaryngology [facility] Phone: [phone] Fax: [phone]    03/13/2023, 4:31 AM

## 2023-03-13 MED ORDER — FLUTICASONE PROPIONATE 50 MCG/ACT NA SUSP: 2.0000 | Freq: Two times a day (BID) | NASAL | 6 refills | Status: DC

## 2023-03-13 MED ORDER — CETIRIZINE HCL 10 MG PO TABS: 10.0000 mg | ORAL_TABLET | Freq: Every day | ORAL | 11 refills | Status: AC

## 2023-03-14 ENCOUNTER — Telehealth: Payer: Self-pay | Admitting: Primary Care

## 2023-03-14 ENCOUNTER — Telehealth: Payer: Managed Care, Other (non HMO) | Admitting: Primary Care

## 2023-03-14 DIAGNOSIS — G4733 Obstructive sleep apnea (adult) (pediatric): Secondary | ICD-10-CM

## 2023-03-14 MED ORDER — ZOLPIDEM TARTRATE ER 12.5 MG PO TBCR: 12.5000 mg | EXTENDED_RELEASE_TABLET | Freq: Every evening | ORAL | 0 refills | Status: DC | PRN

## 2023-03-14 NOTE — Telephone Encounter (Signed)
Patient needs follow-up with me in 4 week, can double book on day that I am not already.

## 2023-03-14 NOTE — Progress Notes (Signed)
Virtual Visit via Video Note  I connected with Carl Kaufman on 03/14/23 at  3:30 PM EST by a video enabled telemedicine application and verified that I am speaking with the correct person using two identifiers.  Location: Patient: Home Provider: Office   I discussed the limitations of evaluation and management by telemedicine and the availability of in person appointments. The patient expressed understanding and agreed to proceed.  History of Present Illness:  62 year old male, never smoked.  Past medical history significant for hemorrhoids, history of colon polyp, umbilical hernia.  Previous [facility] pulmonary encounter: 06/01/2021 Patient presents today for sleep consult. He has hx sleep apnea, he was previously on CPAP. States that he had a repeat sleep study a [facility] 11 years ago (results are not in chart) and that he only passed because he spent the whole time sleeping on his side. His sleeping symptoms have worsened recently. He has symptoms of snoring, restless sleep and daytime sleepiness.  He has been using his mother in laws machine and states that he can not sleep without it. Current CPAP machine is > 35 years old, he is unsure pressure settings. This machine is not registered to him or managed by a provider. He typically goes to bed at 7:30pm and will often wake up 11-11:30pm and is not able to fall back to sleep. He starts his day at 3:30am. He has a lot of daytime sleepiness despite compliance with cpap.   Sleep questionnaire Symptoms- snoring, witnessed apnea, restless sleep, daytime sleepiness Prior sleep study- Hx OSA, 2011 Bedtime- 7:30pm Time to fall asleep -15 mins  Nocturnal awakenings- 5+ times  Out of bed/started today- 3:30am Weight changes- stable Epworth- 20   01/30/2023 Presents today for sleep follow-up. Hx severe obstructive sleep apnea with an AHI of 42.9 and SpO2 low of 77%.  He is compliant with CPAP use nightly. He takes Ambien before going to bed at  7:30pm. It does not take him long to fall asleep, approximately 5-10 mins. He wakes up 45-60 minutes after initially falling asleep and several more times throughout the night.   He also reports falling asleep easily when in active, he wakes himself up shortly after falling asleep gasping for air. He feels he has a lot of upper airway congestion/obstruction. He would like to see ENT for evaluation. No significant breathing issues or cough.   Airview download 11/01/2022 - 01/29/2023 Average usage 90/90 days (100%) greater than 4 hours Average usage 7 hours 16 minutes CPAP pressure 10 cm H2O Air leak 7.4 L/min AHI 1.9 (95%)  Severe obstructive sleep apnea - Patient has severe obstructive sleep apnea, AHI 42.9 an hour with SpO2 low 77%.  He is 100% compliant with CPAP use greater than 4 hours over the last 2 days.  Sleep remains restless/disruptive.  He falls asleep easily when sitting still or in-active, reports waking up shortly after falling asleep gasping for air. CPAP download shows minimal residual AHI on current pressure settings 10 cm H2O.  Will change CPAP to auto settings 10 to 15 cm H2O.  Advised he get a wedge pillow elevate head while sleeping.  Refer to ENT to assess for upper airway obstruction.  If patient continues to struggle with sleep disruption and daytime sleepiness would recommend getting in lab CPAP/BiPAP titration study.  We could also consider adding medication such as modafinil to help with hypersomnia.   Insomnia - Patient has no difficulty falling asleep, continues to struggle with maintaining sleep  - Consider  at follow-up changing Ambien to Lunesta 2 mg at bedtime   03/14/2023- Interim hx  Patient contacted today for follow-up OSA on CPAP and insomnia.  He remains compliant with CPAP. Having a lot of anxiety issues especially at night when trying to fall asleep. He takes [place] 5mg  at bedtime and states that it only work for about an hour. Feels he is not getting  enough air at times and the air coming from his CPAP unit is too humid. He has tired putting ice in water chamber and currently turned off humidity.  Due to work his bedtime is 7:30pm, he starts his day 3:30am. He has tried sleeping with wedge pillow but unable to tolerate. He is unable to nap during the day. He is falling asleep when stilling still or active during the day and waking up gasping for air. He was evaluated by ENT recently. No upper airway obstruction found, recommended consideration for Inspire.   Airview download 02/11/2023 - 03/12/2023 Usage days 29/30 days (97%); 28 days (93%) greater than 4 hours Average usage 6 hours 45 minutes Pressure 10 to 15 cm H2O (11.9 cm H2O-95%) Air leaks 24.7 L/min (95%) AHI 1.0  Observations/Objective:  Unable to connect to video visit  Assessment and Plan:  Severe sleep apnea - Patient has severe sleep apnea and is compliant with CPAP use.  Current pressure 10 to 15 cm H2O without significant residual apneas.  He reports some difficulty falling asleep due to pressure settings and heated humidity.  He has turned off humidity settings on his CPAP.  Advised patient look at adjust ramp time.  We will  readjust pressure settings to help with tolerance.  Recommend changing auto settings to 9 to 13 cm H2O.   Insomnia -Patient has difficulty falling asleep without medication.  He takes Ambien 5 mg at bedtime.  Patient tells me that medication does not last longer than an hour.  Anxiety contributing some to insomnia symptoms.  Recommend changing Ambien to controlled release in hopes to improve sleep quality and therefore decrease daytime events.  Advised patient to take Ambien 30 minutes before bedtime.  If no improvement with change in sleep aid, patient will need CPAP titration study and MSLT to rule out narcolepsy.   Anxiety - Advised patient follow-up with PCP to discuss starting medication to better control daytime symptoms   Follow Up Instructions:   4 weeks with Waynetta Sandy NP or sooner if needed    I discussed the assessment and treatment plan with the patient. The patient was provided an opportunity to ask questions and all were answered. The patient agreed with the plan and demonstrated an understanding of the instructions.   The patient was advised to call back or seek an in-person evaluation if the symptoms worsen or if the condition fails to improve as anticipated.  I provided 35 minutes of non-face-to-face time during this encounter.   Glenford Bayley, NP

## 2023-03-17 MED ORDER — ZOLPIDEM TARTRATE ER 12.5 MG PO TBCR: 12.5000 mg | EXTENDED_RELEASE_TABLET | Freq: Every evening | ORAL | 0 refills | Status: DC | PRN

## 2023-03-17 NOTE — Telephone Encounter (Signed)
Thanks fine, can you add pharmacy and pend to me

## 2023-03-17 NOTE — Telephone Encounter (Signed)
Sent!

## 2023-03-17 NOTE — Telephone Encounter (Signed)
Carl Kaufman, Patient states the Ambien has to be sent to [facility].  Please advise.  Thank you.

## 2023-03-17 NOTE — Telephone Encounter (Signed)
Patient is scheduled 04/17/2023 with Buelah Manis. Patient aware and reminder mailed

## 2023-03-28 ENCOUNTER — Other Ambulatory Visit (HOSPITAL_COMMUNITY): Payer: Self-pay | Admitting: Orthopaedic Surgery

## 2023-03-28 DIAGNOSIS — M79604 Pain in right leg: Secondary | ICD-10-CM

## 2023-03-29 ENCOUNTER — Ambulatory Visit (HOSPITAL_COMMUNITY)
Admission: RE | Admit: 2023-03-29 | Discharge: 2023-03-29 | Disposition: A | Discharge status: Home / Self Care | Payer: Managed Care, Other (non HMO) | Source: Ambulatory Visit | Attending: Orthopaedic Surgery | Admitting: Orthopaedic Surgery

## 2023-03-29 DIAGNOSIS — M79604 Pain in right leg: Secondary | ICD-10-CM | POA: Insufficient documentation

## 2023-04-04 ENCOUNTER — Other Ambulatory Visit: Payer: Self-pay

## 2023-04-04 DIAGNOSIS — I739 Peripheral vascular disease, unspecified: Secondary | ICD-10-CM

## 2023-04-07 NOTE — Progress Notes (Signed)
 Patient name: Carl Kaufman MRN: [id number] DOB: 11-Oct-1960 Sex: male  REASON FOR CONSULT: Ischemic PAD  HPI: ALONZO OWCZARZAK is a 62 y.o. male, with history of hypertension that presents for evaluation of possible ischemic PAD.  Patient reports he had surgery on his right great toe for arthritis on February 19, 2023.  Since that time he developed pain in the right calf into the ankle.  States this hurts all the time particularly in the calf into the back of his ankle.  He is referred here for evaluation of PAD.  He has no complaints in the contralateral left leg.  The incision has been healing without issue.  Past Medical History:  Diagnosis Date   Arthritis    Family history of adverse reaction to anesthesia    PONV   Heart murmur    Hemorrhoids    Hyperplastic colon polyp 01/02/2015   Hypertension    Joint pain    Sleep apnea    Tubular adenoma 2011    Past Surgical History:  Procedure Laterality Date   APPENDECTOMY     20 years ago   ARTHRODESIS METATARSALPHALANGEAL JOINT (MTPJ) Right 02/19/2023   Procedure: ARTHRODESIS METATARSALPHALANGEAL JOINT (MTPJ) RIGHT HALLUXj;  Surgeon: Barton Drape, MD;  Location: [facility];  Service: Orthopedics;  Laterality: Right;   COLONOSCOPY  12/13/2009   Dr.Rourk- normal rectum, diminutive cecal polyp, the remaining of the colonic mucosa appeared normal. bx= tubular adenoma   COLONOSCOPY N/A 01/02/2015   Dr.Rourk- grade 3 internal hemorrhoids present. nearly circumferential apparent surgical scar distal rectum just proximal to the anal verge and internal hemorrhoidal plexus. no evidence of obvious neoplam or other abnoramlity. the colonic mucosa appeared normal except for one dimimutive polyp in the base of the cecum. bx= cecal-benign bymphoid polyp, rectum- hyperplastic polyp   COLONOSCOPY WITH PROPOFOL  N/A 10/23/2020   : external and internal hemorrhoids. One 5 mm polyp. (Tubular adenoma). Grade 4 hemorrhoids.   foot  arthritis Left    scraped    HEMORRHOID BANDING  2016   Dr.Rourk   HERNIA REPAIR Right    inguinal   PLANTAR FASCIA RELEASE Bilateral    POLYPECTOMY  10/23/2020   Procedure: POLYPECTOMY INTESTINAL;  Surgeon: Shaaron Lamar HERO, MD;  Location: [facility];  Service: Endoscopy;;   STAPLE HEMORRHOIDECTOMY     UMBILICAL HERNIA REPAIR N/A 02/06/2018   Procedure: HERNIA REPAIR UMBILICAL ADULT WITH MESH;  Surgeon: Mavis Anes, MD;  Location: [facility];  Service: General;  Laterality: N/A;  umbilical    Family History  Problem Relation Age of Onset   Colon cancer Neg Hx     SOCIAL HISTORY: Social History   Socioeconomic History   Marital status: Married    Spouse name: Not on file   Number of children: Not on file   Years of education: Not on file   Highest education level: Not on file  Occupational History   Not on file  Tobacco Use   Smoking status: Never   Smokeless tobacco: Current    Types: Snuff   Tobacco comments:    snuff  Vaping Use   Vaping status: Never Used  Substance and Sexual Activity   Alcohol use: Yes    Alcohol/week: 0.0 standard drinks of alcohol    Comment: Only in summer 1-2 drinks 4 days out of the week   Drug use: No   Sexual activity: Yes    Birth control/protection: None  Other Topics Concern  Not on file  Social History Narrative   Not on file   Social Drivers of Health   Financial Resource Strain: Not on file  Food Insecurity: Not on file  Transportation Needs: Not on file  Physical Activity: Not on file  Stress: Not on file  Social Connections: Not on file  Intimate Partner Violence: Not on file    Allergies  Allergen Reactions   Trazodone  Anaphylaxis    Current Outpatient Medications  Medication Sig Dispense Refill   aspirin EC 81 MG tablet Take 81 mg by mouth daily. Swallow whole.     CELEBREX 100 MG capsule Take 100 mg by mouth 2 (two) times daily. (Patient not taking: Reported on 03/12/2023)     cetirizine  (ZYRTEC ) 10 MG  tablet Take 1 tablet (10 mg total) by mouth daily. 30 tablet 11   fluticasone  (FLONASE ) 50 MCG/ACT nasal spray Place 2 sprays into both nostrils 2 (two) times daily. 16 g 6   hydrochlorothiazide (HYDRODIURIL) 25 MG tablet Take 25 mg by mouth daily.     lisinopril (ZESTRIL) 40 MG tablet Take 40 mg by mouth daily.     predniSONE (DELTASONE) 5 MG tablet Take 5 mg by mouth daily with breakfast. (Patient not taking: Reported on 03/14/2023)     tiZANidine (ZANAFLEX) 4 MG tablet Take 4 mg by mouth every 6 (six) hours as needed for muscle spasms.     zolpidem  (AMBIEN  CR) 12.5 MG CR tablet Take 1 tablet (12.5 mg total) by mouth at bedtime as needed for sleep. 30 tablet 0   No current facility-administered medications for this visit.    REVIEW OF SYSTEMS:  [X]  denotes positive finding, [ ]  denotes negative finding Cardiac  Comments:  Chest pain or chest pressure:    Shortness of breath upon exertion:    Short of breath when lying flat:    Irregular heart rhythm:        Vascular    Pain in calf, thigh, or hip brought on by ambulation:    Pain in feet at night that wakes you up from your sleep:     Blood clot in your veins:    Leg swelling:         Pulmonary    Oxygen at home:    Productive cough:     Wheezing:         Neurologic    Sudden weakness in arms or legs:     Sudden numbness in arms or legs:     Sudden onset of difficulty speaking or slurred speech:    Temporary loss of vision in one eye:     Problems with dizziness:         Gastrointestinal    Blood in stool:     Vomited blood:         Genitourinary    Burning when urinating:     Blood in urine:        Psychiatric    Major depression:         Hematologic    Bleeding problems:    Problems with blood clotting too easily:        Skin    Rashes or ulcers:        Constitutional    Fever or chills:      PHYSICAL EXAM: There were no vitals filed for this visit.  GENERAL: The patient is a well-nourished male, in no  acute distress. The vital signs are documented above. CARDIAC: There is  a regular rate and rhythm.  VASCULAR:  Bilateral femoral pulses palpable Bilateral popliteal pulses palpable Bilateral dorsalis pedis and posterior tibial pulses palpable PULMONARY: No respiratory distress. ABDOMEN: Soft and non-tender. MUSCULOSKELETAL: There are no major deformities or cyanosis. NEUROLOGIC: No focal weakness or paresthesias are detected. SKIN: There are no ulcers or rashes noted. PSYCHIATRIC: The patient has a normal affect.  DATA:   ABI today 1.24 right multiphasic and 1.25 left multiphasic - normal   Assessment/Plan:  62 y.o. male, with history of hypertension that presents for evaluation of ischemic PAD.  Ultimately he developed right calf and ankle discomfort and has been told this could be a calf strain following arthritis surgery on his right great toe.  I discussed on exam he has easily palpable dorsalis pedis and posterior tibial pulses.  His ABIs today are normal with triphasic waveforms at the ankle.  There is no evidence of significant arterial insufficiency.  He has pulsatile toe tracings as well.  He can follow-up with me as needed.  Discussed likely some other etiology for his pain.   Lonni DOROTHA Gaskins, MD [facility] Office: [phone]

## 2023-04-08 ENCOUNTER — Encounter: Payer: Self-pay | Admitting: Vascular Surgery

## 2023-04-08 ENCOUNTER — Ambulatory Visit (HOSPITAL_COMMUNITY)
Admission: RE | Admit: 2023-04-08 | Discharge: 2023-04-08 | Disposition: A | Discharge status: Home / Self Care | Payer: Managed Care, Other (non HMO) | Source: Ambulatory Visit | Attending: Vascular Surgery | Admitting: Vascular Surgery

## 2023-04-08 ENCOUNTER — Ambulatory Visit: Payer: Managed Care, Other (non HMO) | Admitting: Vascular Surgery

## 2023-04-08 VITALS — BP 159/81 | HR 56 | Temp 98.4°F | Resp 18 | Ht 69.0 in | Wt 219.0 lb

## 2023-04-08 DIAGNOSIS — I739 Peripheral vascular disease, unspecified: Secondary | ICD-10-CM

## 2023-04-08 DIAGNOSIS — M79669 Pain in unspecified lower leg: Secondary | ICD-10-CM | POA: Insufficient documentation

## 2023-04-08 DIAGNOSIS — M79661 Pain in right lower leg: Secondary | ICD-10-CM | POA: Diagnosis not present

## 2023-04-08 LAB — VAS US ABI WITH/WO TBI
Left ABI: 1.25
Right ABI: 1.24

## 2023-04-14 ENCOUNTER — Other Ambulatory Visit: Payer: Self-pay | Admitting: Primary Care

## 2023-04-17 ENCOUNTER — Telehealth: Payer: Self-pay | Admitting: Primary Care

## 2023-04-17 ENCOUNTER — Ambulatory Visit (INDEPENDENT_AMBULATORY_CARE_PROVIDER_SITE_OTHER): Payer: Commercial Managed Care - PPO | Admitting: Primary Care

## 2023-04-17 ENCOUNTER — Encounter: Payer: Self-pay | Admitting: Primary Care

## 2023-04-17 VITALS — BP 128/74 | HR 53 | Temp 98.4°F | Ht 69.0 in | Wt 222.0 lb

## 2023-04-17 DIAGNOSIS — R0981 Nasal congestion: Secondary | ICD-10-CM

## 2023-04-17 DIAGNOSIS — G4733 Obstructive sleep apnea (adult) (pediatric): Secondary | ICD-10-CM | POA: Diagnosis not present

## 2023-04-17 DIAGNOSIS — G4700 Insomnia, unspecified: Secondary | ICD-10-CM | POA: Diagnosis not present

## 2023-04-17 MED ORDER — ZOLPIDEM TARTRATE 10 MG PO TABS: 10.0000 mg | ORAL_TABLET | Freq: Every evening | ORAL | 3 refills | Status: DC | PRN

## 2023-04-17 MED ORDER — IPRATROPIUM BROMIDE 0.03 % NA SOLN: 2.0000 | Freq: Two times a day (BID) | NASAL | 2 refills | Status: AC

## 2023-04-17 NOTE — Telephone Encounter (Signed)
 Pt is requesting refill. It is noted that it was discontinued by Waynetta Sandy, NP. Please advise.

## 2023-04-17 NOTE — Telephone Encounter (Signed)
 PT was just here today. AVS states the following:  INSOMNIA: Insomnia is a condition where you have trouble falling or staying asleep. We discussed that splitting your Ambien  CR is not recommended. We will consider switching you back to immediate release Ambien  10mg , allowing you to take 5mg  at bedtime and another 5mg  if you wake up during the night.   States   [facility] said he got this medication (Ambien ) by mail.The PT has been on the phone with them saying he has not rec'd it on 12/21.  He says he never got it delivered to him. They offered to resend but he is going to tell them not to send it. Because of this he can not get Ambien  by RX unless Ms. Hope calls it in herself.   The [facility] Ambien  expired Monday., He called [facility] to refill  12/6 it ran out Monday. His # is [phone] or [phone]

## 2023-04-17 NOTE — Telephone Encounter (Signed)
 I sent in RX for Ambien 10mg 

## 2023-04-17 NOTE — Telephone Encounter (Signed)
 [facility] is calling. Patient is reciving medication from them and other pharmacies and is over using the drug  Ambien . He's using a mail order pharmacy, [facility] and a another prescriber. [facility] would like a call back to know if it should be canceled.

## 2023-04-17 NOTE — Patient Instructions (Addendum)
-  INSOMNIA: Insomnia is a condition where you have trouble falling or staying asleep. We discussed that splitting your Ambien  CR is not recommended. We will consider switching you back to immediate release Ambien  10mg , allowing you to take 5mg  at bedtime and another 5mg  if you wake up during the night.   -OBSTRUCTIVE SLEEP APNEA: Obstructive sleep apnea is a condition where your breathing stops and starts during sleep. You are doing well with your CPAP machine  -ANXIETY: Anxiety is a feeling of worry or fear that can interfere with daily activities. You are currently taking Ativan and Lexapro. We will continue this regimen and reassess once your sleep issues are resolved.  -SINUS CONGESTION: Sinus congestion is a blockage of the nasal passages usually due to inflammation. Flonase  has not been effective for you, and Afrin is not suitable for regular use. We will try Atrovent  nasal spray twice a day. If there is no improvement, we may refer you back to an ENT specialist.  Follow-up 6 months with [facility] NP or sooner if insomnia is not well controlled

## 2023-04-17 NOTE — Progress Notes (Signed)
 @Patient  ID: Carl Kaufman, male    DOB: 03/17/1961, 63 y.o.   MRN: [id number]  Chief Complaint  Patient presents with   Follow-up    Referring provider: Sheryle Carwin, MD  HPI:  63 year old male, never smoked.  Past medical history significant for hemorrhoids, history of colon polyp, umbilical hernia.  Previous [facility] pulmonary encounter: 06/01/2021 Patient presents today for sleep consult. He has hx sleep apnea, he was previously on CPAP. States that he had a repeat sleep study a [facility] 11 years ago (results are not in chart) and that he only passed because he spent the whole time sleeping on his side. His sleeping symptoms have worsened recently. He has symptoms of snoring, restless sleep and daytime sleepiness.  He has been using his mother in laws machine and states that he can not sleep without it. Current CPAP machine is > 67 years old, he is unsure pressure settings. This machine is not registered to him or managed by a provider. He typically goes to bed at 7:30pm and will often wake up 11-11:30pm and is not able to fall back to sleep. He starts his day at 3:30am. He has a lot of daytime sleepiness despite compliance with cpap.   Sleep questionnaire Symptoms- snoring, witnessed apnea, restless sleep, daytime sleepiness Prior sleep study- Hx OSA, 2011 Bedtime- 7:30pm Time to fall asleep -15 mins  Nocturnal awakenings- 5+ times  Out of bed/started today- 3:30am Weight changes- stable Epworth- 20   01/30/2023 Presents today for sleep follow-up. Hx severe obstructive sleep apnea with an AHI of 42.9 and SpO2 low of 77%.  He is compliant with CPAP use nightly. He takes Ambien  before going to bed at 7:30pm. It does not take him long to fall asleep, approximately 5-10 mins. He wakes up 45-60 minutes after initially falling asleep and several more times throughout the night.   He also reports falling asleep easily when in active, he wakes himself up shortly after falling asleep gasping  for air. He feels he has a lot of upper airway congestion/obstruction. He would like to see ENT for evaluation. No significant breathing issues or cough.   Airview download 11/01/2022 - 01/29/2023 Average usage 90/90 days (100%) greater than 4 hours Average usage 7 hours 16 minutes CPAP pressure 10 cm H2O Air leak 7.4 L/min AHI 1.9 (95%)  Severe obstructive sleep apnea - Patient has severe obstructive sleep apnea, AHI 42.9 an hour with SpO2 low 77%.  He is 100% compliant with CPAP use greater than 4 hours over the last 2 days.  Sleep remains restless/disruptive.  He falls asleep easily when sitting still or in-active, reports waking up shortly after falling asleep gasping for air. CPAP download shows minimal residual AHI on current pressure settings 10 cm H2O.  Will change CPAP to auto settings 10 to 15 cm H2O.  Advised he get a wedge pillow elevate head while sleeping.  Refer to ENT to assess for upper airway obstruction.  If patient continues to struggle with sleep disruption and daytime sleepiness would recommend getting in lab CPAP/BiPAP titration study.  We could also consider adding medication such as modafinil to help with hypersomnia.   Insomnia - Patient has no difficulty falling asleep, continues to struggle with maintaining sleep  - Consider at follow-up changing Ambien  to Lunesta 2 mg at bedtime   03/14/2023- Interim hx  Patient contacted today for follow-up OSA on CPAP and insomnia.  He remains compliant with CPAP. Having a lot of anxiety issues  especially at night when trying to fall asleep. He takes ambien  5mg  at bedtime and states that it only work for about an hour. Feels he is not getting enough air at times and the air coming from his CPAP unit is too humid. He has tired putting ice in water  chamber and currently turned off humidity.  Due to work his bedtime is 7:30pm, he starts his day 3:30am. He has tried sleeping with wedge pillow but unable to tolerate. He is unable to nap  during the day. He is falling asleep when stilling still or active during the day and waking up gasping for air. He was evaluated by ENT recently. No upper airway obstruction found, recommended consideration for Inspire.   Airview download 02/11/2023 - 03/12/2023 Usage days 29/30 days (97%); 28 days (93%) greater than 4 hours Average usage 6 hours 45 minutes Pressure 10 to 15 cm H2O (11.9 cm H2O-95%) Air leaks 24.7 L/min (95%) AHI 1.0  Severe sleep apnea - Patient has severe sleep apnea and is compliant with CPAP use.  Current pressure 10 to 15 cm H2O without significant residual apneas.  He reports some difficulty falling asleep due to pressure settings and heated humidity.  He has turned off humidity settings on his CPAP.  Advised patient look at adjust ramp time.  We will  readjust pressure settings to help with tolerance.  Recommend changing auto settings to 9 to 13 cm H2O.   Insomnia -Patient has difficulty falling asleep without medication.  He takes Ambien  5 mg at bedtime.  Patient tells me that medication does not last longer than an hour.  Anxiety contributing some to insomnia symptoms.  Recommend changing Ambien  to controlled release in hopes to improve sleep quality and therefore decrease daytime events.  Advised patient to take Ambien  30 minutes before bedtime.  If no improvement with change in sleep aid, patient will need CPAP titration study and MSLT to rule out narcolepsy.   Anxiety - Advised patient follow-up with PCP to discuss starting medication to better control daytime symptoms     04/17/2023- Interim hx  Discussed the use of AI scribe software for clinical note transcription with the patient, who gave verbal consent to proceed.  History of Present Illness   The patient, previously diagnosed with OSA, anxiety and insomnia, was switched from short-acting to long-acting Ambien . However, he reports that the full dose of the new medication only provides 1.5 to 3 hours of sleep.  To manage this, the patient has been splitting the dose, taking half at bedtime and the other half upon waking in the middle of the night. This method has reportedly improved his sleep duration.  The patient also uses a CPAP machine for sleep apnea, with which he is fully compliant. He has previously reported feelings of anxiety when trying to fall asleep and a sensation of insufficient air from the CPAP machine. He has attempted to mitigate the latter issue by adding ice to the water  chamber of the device.  In addition to the sleep issues, the patient has been dealing with sinus congestion. He has tried Flonase  without success and has found temporary relief with Afrin, but is aware of the potential for rebound effects with regular use.   Airview download 03/17/2023 - 04/15/2023 Average usage 30/30 days (100%) greater than 4 hours Average usage 7 hours 42 minutes Pressure 9 to 12 cm H2O (10.4 cm H2O-95%) Air leaks 18.6 L/min (95%) AHI 0.9  Allergies  Allergen Reactions   Trazodone  Anaphylaxis  There is no immunization history on file for this patient.  Past Medical History:  Diagnosis Date   Arthritis    Family history of adverse reaction to anesthesia    PONV   Heart murmur    Hemorrhoids    Hyperplastic colon polyp 01/02/2015   Hypertension    Joint pain    Sleep apnea    Tubular adenoma 2011    Tobacco History: Social History   Tobacco Use  Smoking Status Never  Smokeless Tobacco Current   Types: Snuff  Tobacco Comments   snuff   Ready to quit: Not Answered Counseling given: Not Answered Tobacco comments: snuff   Outpatient Medications Prior to Visit  Medication Sig Dispense Refill   aspirin EC 81 MG tablet Take 81 mg by mouth daily. Swallow whole.     cetirizine  (ZYRTEC ) 10 MG tablet Take 1 tablet (10 mg total) by mouth daily. 30 tablet 11   escitalopram (LEXAPRO) 10 MG tablet Take 10 mg by mouth daily.     hydrochlorothiazide (HYDRODIURIL) 25 MG tablet Take  25 mg by mouth daily.     lisinopril (ZESTRIL) 40 MG tablet Take 40 mg by mouth daily.     LORazepam (ATIVAN) 0.5 MG tablet Take 0.5 mg by mouth in the morning, at noon, in the evening, and at bedtime.     meloxicam (MOBIC) 15 MG tablet Take 15 mg by mouth daily.     oxycodone (OXY-IR) 5 MG capsule Take 5 mg by mouth every 4 (four) hours as needed for pain.     tiZANidine (ZANAFLEX) 4 MG tablet Take 4 mg by mouth every 6 (six) hours as needed for muscle spasms.     zolpidem  (AMBIEN  CR) 12.5 MG CR tablet Take 1 tablet (12.5 mg total) by mouth at bedtime as needed for sleep. 30 tablet 0   CELEBREX 100 MG capsule Take 100 mg by mouth 2 (two) times daily. (Patient not taking: Reported on 04/17/2023)     fluticasone  (FLONASE ) 50 MCG/ACT nasal spray Place 2 sprays into both nostrils 2 (two) times daily. (Patient not taking: Reported on 04/17/2023) 16 g 6   predniSONE (DELTASONE) 5 MG tablet Take 5 mg by mouth daily with breakfast. (Patient not taking: Reported on 04/17/2023)     No facility-administered medications prior to visit.      Review of Systems  Review of Systems  Constitutional: Negative.  Negative for fatigue.  HENT:  Positive for congestion and postnasal drip.   Respiratory: Negative.    Cardiovascular: Negative.   Psychiatric/Behavioral:  Positive for sleep disturbance.      Physical Exam  BP 128/74 (BP Location: Right Arm, Patient Position: Sitting, Cuff Size: Normal)   Pulse (!) 53   Temp 98.4 F (36.9 C) (Oral)   Ht 5' 9 (1.753 m)   Wt 222 lb (100.7 kg)   SpO2 98%   BMI 32.78 kg/m  Physical Exam Constitutional:      Appearance: Normal appearance.  HENT:     Head: Normocephalic and atraumatic.     Nose: Congestion present.     Mouth/Throat:     Mouth: Mucous membranes are moist.     Pharynx: Oropharynx is clear.  Cardiovascular:     Rate and Rhythm: Normal rate and regular rhythm.  Pulmonary:     Effort: Pulmonary effort is normal.     Breath sounds: Normal  breath sounds.  Musculoskeletal:        General: Normal range of motion.  Skin:  General: Skin is warm and dry.  Neurological:     General: No focal deficit present.     Mental Status: He is alert and oriented to person, place, and time. Mental status is at baseline.  Psychiatric:        Mood and Affect: Mood normal.        Behavior: Behavior normal.        Thought Content: Thought content normal.        Judgment: Judgment normal.      Lab Results:  CBC    Component Value Date/Time   WBC 8.1 02/02/2018 1509   RBC 5.16 02/02/2018 1509   HGB 16.5 02/02/2018 1509   HCT 46.2 02/02/2018 1509   PLT 256 02/02/2018 1509   MCV 89.5 02/02/2018 1509   MCH 32.0 02/02/2018 1509   MCHC 35.7 02/02/2018 1509   RDW 12.5 02/02/2018 1509   LYMPHSABS 2.0 02/02/2018 1509   MONOABS 0.7 02/02/2018 1509   EOSABS 0.1 02/02/2018 1509   BASOSABS 0.0 02/02/2018 1509    BMET    Component Value Date/Time   NA 140 02/12/2023 1530   K 3.9 02/12/2023 1530   CL 107 02/12/2023 1530   CO2 23 02/12/2023 1530   GLUCOSE 127 (H) 02/12/2023 1530   BUN 17 02/12/2023 1530   CREATININE 1.17 02/12/2023 1530   CALCIUM 9.3 02/12/2023 1530   GFRNONAA >60 02/12/2023 1530   GFRAA >60 02/02/2018 1509    BNP No results found for: BNP  ProBNP No results found for: PROBNP  Imaging: VAS US  ABI WITH/WO TBI Result Date: 04/08/2023  LOWER EXTREMITY DOPPLER STUDY Patient Name:  JOSETH WEIGEL  Date of Exam:   04/08/2023 Medical Rec #: [id number]        Accession #:    [id number] Date of Birth: [birth year]        Patient Gender: M Patient Age:   66 years Exam Location:  [facility] Procedure:      VAS US  ABI WITH/WO TBI Referring Phys: LONNI GASKINS --------------------------------------------------------------------------------  Indications: Rest pain, and peripheral artery disease. Cold Limb R>L High Risk Factors: Hypertension, hyperlipidemia. Other Factors: Recent Right Foot/Great toe  Intervention.  Performing Technologist: Delice Locus RVT  Examination Guidelines: A complete evaluation includes at minimum, Doppler waveform signals and systolic blood pressure reading at the level of bilateral brachial, anterior tibial, and posterior tibial arteries, when vessel segments are accessible. Bilateral testing is considered an integral part of a complete examination. Photoelectric Plethysmograph (PPG) waveforms and toe systolic pressure readings are included as required and additional duplex testing as needed. Limited examinations for reoccurring indications may be performed as noted.  ABI Findings: +---------+------------------+-----+-----------+--------+ Right    Rt Pressure (mmHg)IndexWaveform   Comment  +---------+------------------+-----+-----------+--------+ Brachial 150                                        +---------+------------------+-----+-----------+--------+ PTA      190               1.24 multiphasic         +---------+------------------+-----+-----------+--------+ DP       186               1.22 multiphasic         +---------+------------------+-----+-----------+--------+ Great Toe180               1.18 Normal              +---------+------------------+-----+-----------+--------+ +---------+------------------+-----+-----------+-------+  Left     Lt Pressure (mmHg)IndexWaveform   Comment +---------+------------------+-----+-----------+-------+ Brachial 153                                       +---------+------------------+-----+-----------+-------+ PTA      191               1.25 multiphasic        +---------+------------------+-----+-----------+-------+ DP       187               1.22 multiphasic        +---------+------------------+-----+-----------+-------+ Great Toe139               0.91 Normal             +---------+------------------+-----+-----------+-------+ +-------+-----------+-----------+------------+------------+  ABI/TBIToday's ABIToday's TBIPrevious ABIPrevious TBI +-------+-----------+-----------+------------+------------+ Right  1.24       1.18                                +-------+-----------+-----------+------------+------------+ Left   1.25       0.91                                +-------+-----------+-----------+------------+------------+  Summary: Right: Resting right ankle-brachial index is within normal range. The right toe-brachial index is normal. Left: Resting left ankle-brachial index is within normal range. The left toe-brachial index is normal. *See table(s) above for measurements and observations.  Electronically signed by Lonni Gaskins MD on 04/08/2023 at 8:39:28 AM.    Final    MR TIBIA FIBULA RIGHT WO CONTRAST Result Date: 03/29/2023 CLINICAL DATA:  Pain and right leg. Evaluate for medial gastrocnemius tear. EXAM: MRI OF LOWER RIGHT EXTREMITY WITHOUT CONTRAST TECHNIQUE: Multiplanar, multisequence MR imaging of the right lower leg was performed. No intravenous contrast was administered. COMPARISON:  None Available. FINDINGS: Bones/Joint/Cartilage No evidence of acute fracture, dislocation or aggressive osseous lesion. There is incomplete visualization of the right knee and ankle without demonstrated significant joint effusions. Probable mild medial compartment joint space narrowing at the right knee with mild peripheral meniscal extrusion. Ligaments Not relevant for exam/indication. Muscles and Tendons There is mild T2 hyperintensity within the right soleus muscle within the proximal to lower leg. No focal fluid collection or significant atrophy identified. The gastrocnemius muscles appear normal. The Achilles tendon appears normal, although its calcaneal insertion is incompletely visualized. The additional ankle tendons also appear unremarkable with incomplete visualization of their distal insertions. Soft tissues No soft tissue mass, fluid collection or foreign body  identified. No significant subcutaneous edema or vascular abnormality identified. IMPRESSION: 1. Mild T2 hyperintensity within the right soleus muscle within the proximal to lower leg, nonspecific but possibly reflecting a low-grade strain or subacute denervation. No focal fluid collection or significant atrophy identified. 2. The gastrocnemius muscles and visualized Achilles tendon appear normal. 3. No acute osseous findings. Electronically Signed   By: Elsie Perone M.D.   On: 03/29/2023 12:07     Assessment & Plan:   1. OSA (obstructive sleep apnea) (Primary)  2. Insomnia, unspecified type  3. Sinus congestion     Insomnia Patient reports improved sleep duration with splitting Ambien  CR 12.5mg , however, this is not recommended due to its controlled release formulation.  -Recommend switching back to immediate release Ambien  10mg , allowing patient to  take 5mg  at bedtime and another 5mg  as needed during the night. If this is not affective could add sonata prn   Obstructive Sleep Apnea Patient is 100% compliant with CPAP use. Current pressure 9 to 12 cm H2O; Residual AHI 0.9/hour  -Continue CPAP use as currently prescribed.  Anxiety Patient is currently on Ativan 0.5mg  4 times a day and Lexapro. Patient expresses desire to discontinue these medications once sleep issues are resolved. -Continue current regimen and reassess after sleep issues are addressed.  Sinus Congestion Patient reports persistent sinus congestion, with Flonase  providing no relief and Afrin providing temporary relief but not suitable for regular use due to potential rebound effect. -Trial of Atrovent  nasal spray up to four times a day. -If no improvement, consider referral back to ENT.       Almarie LELON Ferrari, NP 04/17/2023

## 2023-04-18 NOTE — Telephone Encounter (Signed)
 Pharmacy called yesterday they needs auth for the medication from [facility] before it gets refilled. Due to multiple prescription from multiple providers. It is supposed to be getting filled at the [facility] and if it needs to go to [facility] then they need for us  to call the pharmacy [facility]) to inform them of the switch of pharmacies

## 2023-04-18 NOTE — Telephone Encounter (Signed)
 I called and spoke with the pt. He stated he does want his RX to go to [facility]. I called [facility] and had the Ambien  medication authorized per [employer], Np. I also called the [facility] pharmacy and left them a message stating not to fill this pt's Rx, as it is being done through [facility]. NFN.

## 2023-04-18 NOTE — Telephone Encounter (Signed)
 I called and left a VM at [facility] authorizing re-fill. Let me know if there are any further issues.

## 2023-04-18 NOTE — Telephone Encounter (Signed)
 Katie from [facility] states needs to verify medication. Also needs DEA number. Katie phone number is [phone].

## 2023-08-03 ENCOUNTER — Other Ambulatory Visit: Payer: Self-pay | Admitting: Primary Care

## 2023-08-04 NOTE — Telephone Encounter (Signed)
**Note De-identified  [place] Obfuscation** Please advise 

## 2023-11-27 ENCOUNTER — Other Ambulatory Visit: Payer: Self-pay | Admitting: Primary Care

## 2023-11-27 NOTE — Telephone Encounter (Signed)
 Pt requesting refill of Ambien . Please advise, thank you!

## 2024-05-17 ENCOUNTER — Ambulatory Visit: Payer: PRIVATE HEALTH INSURANCE | Admitting: Primary Care
